# Patient Record
Sex: Male | Born: 1995 | Race: Black or African American | Hispanic: No | Marital: Single | Smoking: Never smoker
Health system: Southern US, Community
[De-identification: ages and names within clinical notes are randomized; demographics above are authoritative.]

## PROBLEM LIST (undated history)

## (undated) DIAGNOSIS — J45909 Unspecified asthma, uncomplicated: Secondary | ICD-10-CM

## (undated) DIAGNOSIS — T7840XA Allergy, unspecified, initial encounter: Secondary | ICD-10-CM

## (undated) DIAGNOSIS — B20 Human immunodeficiency virus [HIV] disease: Secondary | ICD-10-CM

## (undated) HISTORY — DX: Allergy, unspecified, initial encounter: T78.40XA

## (undated) HISTORY — PX: ADENOIDECTOMY: SUR15

---

## 2014-04-16 ENCOUNTER — Emergency Department (HOSPITAL_COMMUNITY)
Admission: EM | Admit: 2014-04-16 | Discharge: 2014-04-17 | Disposition: A | Discharge status: Home / Self Care | Payer: Managed Care, Other (non HMO) | Attending: Emergency Medicine | Admitting: Emergency Medicine

## 2014-04-16 ENCOUNTER — Encounter (HOSPITAL_COMMUNITY): Payer: Self-pay | Admitting: Emergency Medicine

## 2014-04-16 DIAGNOSIS — J45909 Unspecified asthma, uncomplicated: Secondary | ICD-10-CM | POA: Diagnosis not present

## 2014-04-16 DIAGNOSIS — R059 Cough, unspecified: Secondary | ICD-10-CM

## 2014-04-16 DIAGNOSIS — J111 Influenza due to unidentified influenza virus with other respiratory manifestations: Secondary | ICD-10-CM | POA: Insufficient documentation

## 2014-04-16 DIAGNOSIS — R05 Cough: Secondary | ICD-10-CM

## 2014-04-16 DIAGNOSIS — R509 Fever, unspecified: Secondary | ICD-10-CM | POA: Diagnosis present

## 2014-04-16 HISTORY — DX: Unspecified asthma, uncomplicated: J45.909

## 2014-04-16 LAB — RAPID STREP SCREEN (MED CTR MEBANE ONLY): STREPTOCOCCUS, GROUP A SCREEN (DIRECT): NEGATIVE

## 2014-04-16 MED ORDER — ACETAMINOPHEN 325 MG PO TABS
650.0000 mg | ORAL_TABLET | Freq: Once | ORAL | Status: AC
  Administered 2014-04-16: 650 mg via ORAL
  Filled 2014-04-16: qty 2

## 2014-04-16 NOTE — ED Provider Notes (Signed)
CSN: [id number]     Arrival date & time 04/16/14  1918 History   First MD Initiated Contact with Patient 04/16/14 2346     Chief Complaint  Patient presents with  . Sore Throat  . Fever     (Consider location/radiation/quality/duration/timing/severity/associated sxs/prior Treatment) HPI Comments: Patient with history of asthma, presents emergency department with chief complaint of flu-like symptoms. Patient states that he began running a fever, having sore throat, cough, and generalized body aches that started yesterday. He has not tried taking anything to alleviate his symptoms. There are no aggravating or alleviating factors. Patient did not get a flu shot this year. He reports having a fever to 102 prior to arrival. He was given Tylenol in the emergency department.  The history is provided by the patient. No language interpreter was used.    Past Medical History  Diagnosis Date  . Asthma    History reviewed. No pertinent past surgical history. History reviewed. No pertinent family history. History  Substance Use Topics  . Smoking status: Never Smoker   . Smokeless tobacco: Never Used  . Alcohol Use: No    Review of Systems  Constitutional: Positive for fever. Negative for chills.  HENT: Positive for sore throat.   Respiratory: Positive for cough. Negative for shortness of breath.   Cardiovascular: Negative for chest pain.  Gastrointestinal: Negative for nausea, vomiting, diarrhea and constipation.  Genitourinary: Negative for dysuria.  All other systems reviewed and are negative.     Allergies  Review of patient's allergies indicates no known allergies.  Home Medications   Prior to Admission medications   Not on File   BP 127/80 mmHg  Pulse 79  Temp(Src) 100.8 F (38.2 C) (Oral)  Resp 16  Ht 5\' 7"  (1.702 m)  Wt 152 lb (68.947 kg)  BMI 23.80 kg/m2  SpO2 100% Physical Exam  Constitutional: He is oriented to person, place, and time. He appears  well-developed and well-nourished.  HENT:  Head: Normocephalic and atraumatic.  Oropharynx erythematous, no tonsillar exudates, no abscess  Eyes: Conjunctivae and EOM are normal. Pupils are equal, round, and reactive to light. Right eye exhibits no discharge. Left eye exhibits no discharge. No scleral icterus.  Neck: Normal range of motion. Neck supple. No JVD present.  Cardiovascular: Normal rate, regular rhythm and normal heart sounds.  Exam reveals no gallop and no friction rub.   No murmur heard. Pulmonary/Chest: Effort normal and breath sounds normal. No respiratory distress. He has no wheezes. He has no rales. He exhibits no tenderness.  Clear to auscultation  Abdominal: Soft. He exhibits no distension and no mass. There is no tenderness. There is no rebound and no guarding.  Musculoskeletal: Normal range of motion. He exhibits no edema or tenderness.  Neurological: He is alert and oriented to person, place, and time.  Skin: Skin is warm and dry.  Psychiatric: He has a normal mood and affect. His behavior is normal. Judgment and thought content normal.  Nursing note and vitals reviewed.   ED Course  Procedures (including critical care time) Labs Review Labs Reviewed  RAPID STREP SCREEN  CULTURE, GROUP A STREP    Imaging Review Dg Chest 2 View  04/17/2014   CLINICAL DATA:  Sore throat, fever, leg weakness  EXAM: CHEST  2 VIEW  COMPARISON:  None.  FINDINGS: The heart size and mediastinal contours are within normal limits. Both lungs are clear. The visualized skeletal structures are unremarkable.  IMPRESSION: No active cardiopulmonary disease.   Electronically  Signed   By: Elige KoHetal  Patel   On: 04/17/2014 00:55     EKG Interpretation None      MDM   Final diagnoses:  Cough  Influenza    Patient with probable influenza. Patient does have a history of asthma, therefore will check chest x-ray. If negative, plan for discharge.  Patient instructed to increase fluids. Offered  treatment with Tamiflu. Fever control with Tylenol and ibuprofen. Patient is stable and ready for discharge.    Roxy Horsemanobert Itay Mella, PA-C 04/17/14 [id number]  Tomasita CrumbleAdeleke Oni, MD 04/17/14 [phone]

## 2014-04-16 NOTE — ED Notes (Signed)
Pt arrived to the ED with a complaint of a sore throat, fever and leg weakness.  Pt has been experiencing the symptoms for a day

## 2014-04-17 ENCOUNTER — Emergency Department (HOSPITAL_COMMUNITY): Payer: Managed Care, Other (non HMO)

## 2014-04-17 MED ORDER — IBUPROFEN 800 MG PO TABS
800.0000 mg | ORAL_TABLET | Freq: Once | ORAL | Status: AC
  Administered 2014-04-17: 800 mg via ORAL
  Filled 2014-04-17: qty 1

## 2014-04-17 MED ORDER — OSELTAMIVIR PHOSPHATE 75 MG PO CAPS: 75.0000 mg | ORAL_CAPSULE | Freq: Two times a day (BID) | ORAL | Status: DC

## 2014-04-17 NOTE — Discharge Instructions (Signed)

## 2014-04-18 LAB — CULTURE, GROUP A STREP

## 2015-04-05 ENCOUNTER — Emergency Department (HOSPITAL_COMMUNITY): Payer: Managed Care, Other (non HMO)

## 2015-04-05 ENCOUNTER — Encounter (HOSPITAL_COMMUNITY): Payer: Self-pay | Admitting: Emergency Medicine

## 2015-04-05 ENCOUNTER — Emergency Department (HOSPITAL_COMMUNITY)
Admission: EM | Admit: 2015-04-05 | Discharge: 2015-04-06 | Disposition: A | Discharge status: Home / Self Care | Payer: Managed Care, Other (non HMO) | Source: Home / Self Care | Attending: Emergency Medicine | Admitting: Emergency Medicine

## 2015-04-05 DIAGNOSIS — D500 Iron deficiency anemia secondary to blood loss (chronic): Secondary | ICD-10-CM | POA: Diagnosis not present

## 2015-04-05 DIAGNOSIS — S42302A Unspecified fracture of shaft of humerus, left arm, initial encounter for closed fracture: Secondary | ICD-10-CM

## 2015-04-05 DIAGNOSIS — R52 Pain, unspecified: Secondary | ICD-10-CM

## 2015-04-05 DIAGNOSIS — S7011XA Contusion of right thigh, initial encounter: Secondary | ICD-10-CM

## 2015-04-05 LAB — COMPREHENSIVE METABOLIC PANEL
ALT: 15 U/L — AB (ref 17–63)
ANION GAP: 10 (ref 5–15)
AST: 36 U/L (ref 15–41)
Albumin: 4.3 g/dL (ref 3.5–5.0)
Alkaline Phosphatase: 62 U/L (ref 38–126)
BUN: 11 mg/dL (ref 6–20)
CHLORIDE: 106 mmol/L (ref 101–111)
CO2: 25 mmol/L (ref 22–32)
CREATININE: 1.15 mg/dL (ref 0.61–1.24)
Calcium: 9.6 mg/dL (ref 8.9–10.3)
Glucose, Bld: 99 mg/dL (ref 65–99)
POTASSIUM: 4 mmol/L (ref 3.5–5.1)
SODIUM: 141 mmol/L (ref 135–145)
Total Bilirubin: 0.7 mg/dL (ref 0.3–1.2)
Total Protein: 7.8 g/dL (ref 6.5–8.1)

## 2015-04-05 LAB — CBC
HEMATOCRIT: 42.1 % (ref 39.0–52.0)
HEMOGLOBIN: 14 g/dL (ref 13.0–17.0)
MCH: 28.2 pg (ref 26.0–34.0)
MCHC: 33.3 g/dL (ref 30.0–36.0)
MCV: 84.7 fL (ref 78.0–100.0)
PLATELETS: 251 10*3/uL (ref 150–400)
RBC: 4.97 MIL/uL (ref 4.22–5.81)
RDW: 12.9 % (ref 11.5–15.5)
WBC: 6.8 10*3/uL (ref 4.0–10.5)

## 2015-04-05 LAB — SAMPLE TO BLOOD BANK

## 2015-04-05 LAB — PROTIME-INR
INR: 1.16 (ref 0.00–1.49)
PROTHROMBIN TIME: 14.9 s (ref 11.6–15.2)

## 2015-04-05 LAB — ETHANOL

## 2015-04-05 LAB — CDS SEROLOGY

## 2015-04-05 MED ORDER — ONDANSETRON HCL 4 MG/2ML IJ SOLN
4.0000 mg | Freq: Once | INTRAMUSCULAR | Status: AC
  Administered 2015-04-05: 4 mg via INTRAVENOUS
  Filled 2015-04-05: qty 2

## 2015-04-05 MED ORDER — HYDROMORPHONE HCL 1 MG/ML IJ SOLN
1.0000 mg | Freq: Once | INTRAMUSCULAR | Status: AC
  Administered 2015-04-05: 1 mg via INTRAVENOUS
  Filled 2015-04-05: qty 1

## 2015-04-05 MED ORDER — FENTANYL CITRATE (PF) 100 MCG/2ML IJ SOLN
50.0000 ug | Freq: Once | INTRAMUSCULAR | Status: AC
  Administered 2015-04-05: 50 ug via INTRAVENOUS
  Filled 2015-04-05: qty 2

## 2015-04-05 MED ORDER — KETOROLAC TROMETHAMINE 30 MG/ML IJ SOLN
30.0000 mg | Freq: Once | INTRAMUSCULAR | Status: AC
  Administered 2015-04-05: 30 mg via INTRAVENOUS
  Filled 2015-04-05: qty 1

## 2015-04-05 MED ORDER — HYDROCODONE-ACETAMINOPHEN 5-325 MG PO TABS
1.0000 | ORAL_TABLET | Freq: Once | ORAL | Status: AC
  Administered 2015-04-05: 1 via ORAL
  Filled 2015-04-05: qty 1

## 2015-04-05 MED ORDER — HYDROCODONE-ACETAMINOPHEN 5-325 MG PO TABS: 1.0000 | ORAL_TABLET | Freq: Four times a day (QID) | ORAL | Status: DC | PRN

## 2015-04-05 NOTE — Progress Notes (Signed)
Orthopedic Tech Progress Note Patient Details:  Kenneth Frank 1996-05-08 [id number]  Ortho Devices Type of Ortho Device: Ace wrap, Shoulder immobilizer, Coapt Ortho Device/Splint Location: LUE Ortho Device/Splint Interventions: Ordered, Application   [name], [name] 04/05/2015, 10:38 PM
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
## 2015-04-05 NOTE — Discharge Instructions (Signed)
As discussed, it is very important that she follow up with our orthopedic colleague early next week for additional evaluation of your broken arm.  Please monitor your condition carefully, and do not hesitate to return here if you develop new, or concerning changes in your condition.
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
## 2015-04-05 NOTE — ED Notes (Signed)
Ortho to come place splint on pt.

## 2015-04-07 ENCOUNTER — Emergency Department (HOSPITAL_COMMUNITY): Payer: Managed Care, Other (non HMO)

## 2015-04-07 ENCOUNTER — Inpatient Hospital Stay (HOSPITAL_COMMUNITY)
Admission: EM | Admit: 2015-04-07 | Discharge: 2015-04-11 | DRG: 562 | Disposition: A | Discharge status: Rehabilitation Facility | Payer: Managed Care, Other (non HMO) | Attending: General Surgery | Admitting: General Surgery

## 2015-04-07 ENCOUNTER — Inpatient Hospital Stay (HOSPITAL_COMMUNITY): Payer: Managed Care, Other (non HMO)

## 2015-04-07 ENCOUNTER — Encounter (HOSPITAL_COMMUNITY): Payer: Self-pay

## 2015-04-07 DIAGNOSIS — K5900 Constipation, unspecified: Secondary | ICD-10-CM | POA: Diagnosis not present

## 2015-04-07 DIAGNOSIS — S42302S Unspecified fracture of shaft of humerus, left arm, sequela: Secondary | ICD-10-CM | POA: Diagnosis not present

## 2015-04-07 DIAGNOSIS — S064XAA Epidural hemorrhage with loss of consciousness status unknown, initial encounter: Secondary | ICD-10-CM | POA: Diagnosis present

## 2015-04-07 DIAGNOSIS — S42302A Unspecified fracture of shaft of humerus, left arm, initial encounter for closed fracture: Principal | ICD-10-CM | POA: Diagnosis present

## 2015-04-07 DIAGNOSIS — R40243 Glasgow coma scale score 3-8, unspecified time: Secondary | ICD-10-CM | POA: Diagnosis present

## 2015-04-07 DIAGNOSIS — J45909 Unspecified asthma, uncomplicated: Secondary | ICD-10-CM | POA: Diagnosis present

## 2015-04-07 DIAGNOSIS — D500 Iron deficiency anemia secondary to blood loss (chronic): Secondary | ICD-10-CM | POA: Diagnosis present

## 2015-04-07 DIAGNOSIS — S064X9A Epidural hemorrhage with loss of consciousness of unspecified duration, initial encounter: Secondary | ICD-10-CM

## 2015-04-07 DIAGNOSIS — S064X0A Epidural hemorrhage without loss of consciousness, initial encounter: Secondary | ICD-10-CM | POA: Diagnosis present

## 2015-04-07 DIAGNOSIS — T148XXA Other injury of unspecified body region, initial encounter: Secondary | ICD-10-CM

## 2015-04-07 DIAGNOSIS — G44309 Post-traumatic headache, unspecified, not intractable: Secondary | ICD-10-CM | POA: Diagnosis not present

## 2015-04-07 DIAGNOSIS — S0219XA Other fracture of base of skull, initial encounter for closed fracture: Secondary | ICD-10-CM | POA: Diagnosis present

## 2015-04-07 DIAGNOSIS — D62 Acute posthemorrhagic anemia: Secondary | ICD-10-CM | POA: Diagnosis present

## 2015-04-07 DIAGNOSIS — H9312 Tinnitus, left ear: Secondary | ICD-10-CM | POA: Diagnosis present

## 2015-04-07 DIAGNOSIS — S069X0S Unspecified intracranial injury without loss of consciousness, sequela: Secondary | ICD-10-CM | POA: Diagnosis not present

## 2015-04-07 DIAGNOSIS — S0219XS Other fracture of base of skull, sequela: Secondary | ICD-10-CM | POA: Diagnosis not present

## 2015-04-07 LAB — BASIC METABOLIC PANEL
Anion gap: 6 (ref 5–15)
BUN: 10 mg/dL (ref 6–20)
CALCIUM: 8.6 mg/dL — AB (ref 8.9–10.3)
CO2: 29 mmol/L (ref 22–32)
CREATININE: 0.94 mg/dL (ref 0.61–1.24)
Chloride: 106 mmol/L (ref 101–111)
GFR calc Af Amer: >60 mL/min (ref >60)
GLUCOSE: 98 mg/dL (ref 65–99)
Potassium: 3.5 mmol/L (ref 3.5–5.1)
Sodium: 141 mmol/L (ref 135–145)

## 2015-04-07 LAB — CBC WITH DIFFERENTIAL/PLATELET
BASOS ABS: 0 10*3/uL (ref 0.0–0.1)
Basophils Relative: 0 %
EOS PCT: 2 %
Eosinophils Absolute: 0.1 10*3/uL (ref 0.0–0.7)
HEMATOCRIT: 32.3 % — AB (ref 39.0–52.0)
Hemoglobin: 10.7 g/dL — ABNORMAL LOW (ref 13.0–17.0)
LYMPHS PCT: 43 %
Lymphs Abs: 1.3 10*3/uL (ref 0.7–4.0)
MCH: 28.5 pg (ref 26.0–34.0)
MCHC: 33.1 g/dL (ref 30.0–36.0)
MCV: 85.9 fL (ref 78.0–100.0)
MONO ABS: 0.5 10*3/uL (ref 0.1–1.0)
MONOS PCT: 16 %
NEUTROS ABS: 1.2 10*3/uL — AB (ref 1.7–7.7)
Neutrophils Relative %: 40 %
PLATELETS: 136 10*3/uL — AB (ref 150–400)
RBC: 3.76 MIL/uL — ABNORMAL LOW (ref 4.22–5.81)
RDW: 13 % (ref 11.5–15.5)
WBC: 2.9 10*3/uL — ABNORMAL LOW (ref 4.0–10.5)

## 2015-04-07 LAB — MRSA PCR SCREENING: MRSA by PCR: NEGATIVE

## 2015-04-07 MED ORDER — ONDANSETRON HCL 4 MG/2ML IJ SOLN
4.0000 mg | Freq: Four times a day (QID) | INTRAMUSCULAR | Status: DC | PRN
  Administered 2015-04-07 – 2015-04-10 (×4): 4 mg via INTRAVENOUS
  Filled 2015-04-07 (×5): qty 2

## 2015-04-07 MED ORDER — KETOROLAC TROMETHAMINE 30 MG/ML IJ SOLN
30.0000 mg | Freq: Once | INTRAMUSCULAR | Status: AC
  Administered 2015-04-07: 30 mg via INTRAVENOUS
  Filled 2015-04-07: qty 1

## 2015-04-07 MED ORDER — IOHEXOL 300 MG/ML  SOLN
75.0000 mL | Freq: Once | INTRAMUSCULAR | Status: AC | PRN
  Administered 2015-04-07: 75 mL via INTRAVENOUS

## 2015-04-07 MED ORDER — HYDROMORPHONE HCL 1 MG/ML IJ SOLN
1.0000 mg | Freq: Once | INTRAMUSCULAR | Status: AC
  Administered 2015-04-07: 1 mg via INTRAVENOUS
  Filled 2015-04-07: qty 1

## 2015-04-07 MED ORDER — PANTOPRAZOLE SODIUM 40 MG IV SOLR
40.0000 mg | Freq: Every day | INTRAVENOUS | Status: DC
  Administered 2015-04-07: 40 mg via INTRAVENOUS
  Filled 2015-04-07: qty 40

## 2015-04-07 MED ORDER — FLUTICASONE-SALMETEROL 100-50 MCG/DOSE IN AEPB
1.0000 | INHALATION_SPRAY | Freq: Two times a day (BID) | RESPIRATORY_TRACT | Status: DC
  Filled 2015-04-07: qty 14

## 2015-04-07 MED ORDER — HYDROCODONE-ACETAMINOPHEN 5-325 MG PO TABS: 1.0000 | ORAL_TABLET | Freq: Four times a day (QID) | ORAL | Status: DC | PRN

## 2015-04-07 MED ORDER — SODIUM CHLORIDE 0.9 % IV SOLN
INTRAVENOUS | Status: DC
  Administered 2015-04-07 (×3): via INTRAVENOUS

## 2015-04-07 MED ORDER — PANTOPRAZOLE SODIUM 40 MG PO TBEC: 40.0000 mg | DELAYED_RELEASE_TABLET | Freq: Every day | ORAL | Status: DC

## 2015-04-07 MED ORDER — ONDANSETRON HCL 4 MG PO TABS: 4.0000 mg | ORAL_TABLET | Freq: Four times a day (QID) | ORAL | Status: DC | PRN

## 2015-04-07 MED ORDER — SODIUM CHLORIDE 0.9 % IV BOLUS (SEPSIS)
1000.0000 mL | Freq: Once | INTRAVENOUS | Status: AC
  Administered 2015-04-07: 1000 mL via INTRAVENOUS

## 2015-04-07 MED ORDER — MONTELUKAST SODIUM 5 MG PO CHEW
5.0000 mg | CHEWABLE_TABLET | Freq: Every day | ORAL | Status: DC
  Administered 2015-04-08 – 2015-04-10 (×3): 5 mg via ORAL
  Filled 2015-04-07 (×5): qty 1

## 2015-04-07 MED ORDER — HYDROMORPHONE HCL 1 MG/ML IJ SOLN
0.5000 mg | INTRAMUSCULAR | Status: DC | PRN
  Administered 2015-04-07 – 2015-04-08 (×7): 0.5 mg via INTRAVENOUS
  Filled 2015-04-07 (×5): qty 1

## 2015-04-07 MED ORDER — MOMETASONE FURO-FORMOTEROL FUM 100-5 MCG/ACT IN AERO
2.0000 | INHALATION_SPRAY | Freq: Two times a day (BID) | RESPIRATORY_TRACT | Status: DC
  Administered 2015-04-08 – 2015-04-10 (×3): 2 via RESPIRATORY_TRACT
  Filled 2015-04-07 (×2): qty 8.8
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
## 2015-04-07 NOTE — ED Notes (Signed)
Patient transported to CT 
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
## 2015-04-07 NOTE — Progress Notes (Signed)
PT Cancellation Note  Patient Details Name: Kenneth Frank MRN: [id number] DOB: [birth year]   Cancelled Treatment:    Reason Eval/Treat Not Completed: Patient not medically ready.  Noted Neurosurgery consulted, but note not completed to indicate if pt is appropriate for PT eval at this time.  Spoke with RN who indicates she is still awaiting Neurosurgery recs.  Will hold PT eval at this time and f/u as appropriate.     [name], Alison MurrayMegan F 04/07/2015, 1:19 PM

## 2015-04-08 ENCOUNTER — Inpatient Hospital Stay (HOSPITAL_COMMUNITY): Payer: Managed Care, Other (non HMO)

## 2015-04-08 DIAGNOSIS — S0219XA Other fracture of base of skull, initial encounter for closed fracture: Secondary | ICD-10-CM | POA: Diagnosis present

## 2015-04-08 DIAGNOSIS — D62 Acute posthemorrhagic anemia: Secondary | ICD-10-CM | POA: Diagnosis present

## 2015-04-08 DIAGNOSIS — S42302A Unspecified fracture of shaft of humerus, left arm, initial encounter for closed fracture: Secondary | ICD-10-CM | POA: Diagnosis present

## 2015-04-08 MED ORDER — HYDROMORPHONE HCL 1 MG/ML IJ SOLN
0.5000 mg | INTRAMUSCULAR | Status: DC | PRN
  Administered 2015-04-08 – 2015-04-10 (×5): 0.5 mg via INTRAVENOUS
  Filled 2015-04-08 (×6): qty 1

## 2015-04-08 MED ORDER — POLYETHYLENE GLYCOL 3350 17 G PO PACK
17.0000 g | PACK | Freq: Every day | ORAL | Status: DC
  Administered 2015-04-08 – 2015-04-10 (×2): 17 g via ORAL
  Filled 2015-04-08 (×3): qty 1

## 2015-04-08 MED ORDER — DOCUSATE SODIUM 100 MG PO CAPS
100.0000 mg | ORAL_CAPSULE | Freq: Two times a day (BID) | ORAL | Status: DC
  Administered 2015-04-08 – 2015-04-10 (×5): 100 mg via ORAL
  Filled 2015-04-08 (×7): qty 1

## 2015-04-08 MED ORDER — OXYCODONE HCL 5 MG PO TABS
5.0000 mg | ORAL_TABLET | ORAL | Status: DC | PRN
  Administered 2015-04-08: 15 mg via ORAL
  Administered 2015-04-09 – 2015-04-11 (×10): 10 mg via ORAL
  Filled 2015-04-08 (×7): qty 2
  Filled 2015-04-08: qty 3
  Filled 2015-04-08 (×3): qty 2

## 2015-04-08 NOTE — Progress Notes (Signed)
04/08/2015  [facility] [place] Levels I-VI of Cognitive Functioning-Daily Tracking Sheet         Date of Onset __11/4/16_______  Level of function Behavioral Characteristics Initial Eval.  Date: __11/5/16__ Date and initial when observed   Level I No response Total Assistance Complete absence of observable change in behavior when presented visual, auditory, tactile, proprioceptive, vestibular or painful stimuli.             Level II Generalized response  Total Assistance Demonstrates generalized reflex response to painful stimuli       Responds to repeated auditory stimuli with increased or decreased activity       Responds to external stimuli with physiological changes generalized, gross body movement and / or not purposeful vocalization               Responses noted above may be same regardless of type and location of stimuli        Responses may be significantly delayed      Level III Localized response  Total Assistance Demonstrates withdrawal or vocalization to painful stimuli       Turns toward or away from auditory stimuli        Blinks when strong light crosses visual field        Follows moving object passed within visual field        Responds to discomfort by pulling tubes or restraints       Responds inconsistently to simple commands       Responses directly related to type of stimulus       May respond to some persons (especially friends and family) but not to others       Level IV Confused/Agitated  Maximal Assistance       Alert and in heightened state of anxiety        Purposeful attempts to remove restraints or tubes or crawl out of bed        May perform motor activities such as sitting, reaching and walking without any apparent purpose or upon another's request        Brief and usually non purposeful moments of focused and sustained attention       Post traumatic amnesia state        Absent goal directed, problem  solving, self monitoring behavior       May cry or scream out of proportion to stimulus even after it's removal       May exhibit aggressive or flight behavior        Mood swing from euphoric to hostile with no apparent relationship to environmental events        Verbalizations are frequently incoherent and/or inappropriate to activity or environment             Level V   Confused/InappropriateNon-Agitated  Maximal Assistance Alert, not agitated but may wander randomly or with vague intention of going home        May become agitated in response to external stimulation and/or lack of environmental structure. 04/08/15- RBM      Not orientated to person, place and time.       Frequent brief periods, non-purposeful sustained attention.       Severely impaired recent memory, with confusion of past and present in reaction to ongoing activity.        Absent goal directed, problem solving behavior.        Often demonstrates inappropriate use of objects without external direction.  May be able to perform previously learned tasks when structure and cues provided.       Unable to learn new information       Able to respond appropriately to simple commands fairly consistently with external structure and cues. 04/08/15- RBM      Responses to simple commands without external structure are random and non-purposeful in relation to the command       Able to converse on a social, automatic level for brief periods of time when provided external structure and cues.        Verbalizations about present events become inappropriate and confabulatory when external structure and cues are not provided.                Level of function Level VI Confused/Appropriate  Maximal Assistance Behavioral Characteristics Initial Eval.  Date: _____ Date and initial when observed    Able to attend to highly familiar tasks in non-distracting environment for 30 minutes with moderate redirection.        Remote memory has more depth and detail than recent memory        Vague recognition of some staff.       Able to use assistive memory aide with maximal assist.       Emerging awareness of appropriate response to self, family and basic needs.        Emerging goal directed behavior.        Moderate assist to problem solve barriers to task completion.        Supervised for old learning (e.g. self care).       Shows carry over for relearned familiar tasks (e.g. self care).       Maximal assistance for new learning with little or no carry over.       Unaware of impairments, disabilities and safety risks.       Consistently follows simple directions       Verbal expressions are appropriate in highly familiar and structured situations.       DO  NOT SIGN NOTE. ALWAYS SHARE UNLESS PATIENT HAS PROGRESSED BEYOND A RANCHO LEVEL VI. THIS WAY, ALL TEAM MEMBERS HAVE ACCESS TO DOCUMENT ON TRACKING SHEET. THANK YOU.

## 2015-04-08 NOTE — Progress Notes (Signed)
No acute events. Complains of headaches. AVSS Awake and alert Follows commands throughout CT reviewed, stable Neurologically unchanged, no need for surgical intervention Continue medical management
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
## 2015-04-09 MED ORDER — BACITRACIN-NEOMYCIN-POLYMYXIN OINTMENT TUBE
TOPICAL_OINTMENT | CUTANEOUS | Status: DC | PRN
  Filled 2015-04-09: qty 15

## 2015-04-09 NOTE — Progress Notes (Signed)
Patient ID: Carolanne Grumblingaron Pardue, male   DOB: 1995/08/23, 19 y.o.   MRN: [id number] Will order a Sarmiento functional fracture brace to be placed around his left humerus shaft fracture.  i have spoken with the Ortho Tech who will order the brace through [employer].  The current coaptation slint will be removed and replaced by this brace.
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
## 2015-04-09 NOTE — Progress Notes (Signed)
Patient sleeping Headaches better per family Neurological function unchanged per family and nurse Stable, continue rehabilitation

## 2015-04-10 ENCOUNTER — Encounter (HOSPITAL_COMMUNITY): Payer: Self-pay | Admitting: Physical Medicine and Rehabilitation

## 2015-04-10 DIAGNOSIS — D696 Thrombocytopenia, unspecified: Secondary | ICD-10-CM

## 2015-04-10 DIAGNOSIS — S42302A Unspecified fracture of shaft of humerus, left arm, initial encounter for closed fracture: Secondary | ICD-10-CM

## 2015-04-10 DIAGNOSIS — D62 Acute posthemorrhagic anemia: Secondary | ICD-10-CM

## 2015-04-10 DIAGNOSIS — S069X0S Unspecified intracranial injury without loss of consciousness, sequela: Secondary | ICD-10-CM

## 2015-04-10 DIAGNOSIS — G44309 Post-traumatic headache, unspecified, not intractable: Secondary | ICD-10-CM

## 2015-04-10 DIAGNOSIS — G8911 Acute pain due to trauma: Secondary | ICD-10-CM

## 2015-04-10 DIAGNOSIS — K5900 Constipation, unspecified: Secondary | ICD-10-CM

## 2015-04-10 DIAGNOSIS — J45909 Unspecified asthma, uncomplicated: Secondary | ICD-10-CM

## 2015-04-10 NOTE — Progress Notes (Signed)
PT Cancellation Note  Patient Details Name: Kenneth Frank MRN: [id number] DOB: 10-Jul-1995   Cancelled Treatment:    Reason Eval/Treat Not Completed: Per RN, pt was put back to bed and then got up again this pm. SLP worked with pt and mother became upset because she feels pt is not being allowed to rest. Nursing assisted pt back to bed less than 45 minutes ago. PT deferred at this time per family request.   Kenneth Frank 04/10/2015, 4:08 PM  Pager [phone]
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
## 2015-04-10 NOTE — Progress Notes (Signed)
Subjective: Patient reports lessening headache  Objective: Vital signs in last 24 hours: Temp:  [97.7 F (36.5 C)-99.9 F (37.7 C)] 99.9 F (37.7 C) (11/07 0537) Pulse Rate:  [56-102] 84 (11/07 0537) Resp:  [16-18] 16 (11/07 0537) BP: (130-136)/(77-91) 131/80 mmHg (11/07 0537) SpO2:  [98 %-100 %] 100 % (11/07 0537)  Intake/Output from previous day:   Intake/Output this shift:    Awakens to voice, follows commands all extremities. Family present reporting increased LUE pain since yesterday.  Pt reports headache continues to improve.   Lab Results: No results for input(s): WBC, HGB, HCT, PLT in the last 72 hours. BMET No results for input(s): NA, K, CL, CO2, GLUCOSE, BUN, CREATININE, CALCIUM in the last 72 hours.  Studies/Results: No results found.  Assessment/Plan: Improving   LOS: 3 days  No NS follow-up needed unless headache worsens or persists three more weeks. If either, pt should have f/u head CT & return to office.    Kenneth Frank, Kenneth Frank 04/10/2015, 7:55 AM
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
## 2015-04-10 NOTE — Progress Notes (Signed)
Patient ID: Kenneth Frank, male   DOB: 21-Nov-1995, 19 y.o.   MRN: [id number]   LOS: 3 days   Subjective: Sleeping, says feels better.   Objective: Vital signs in last 24 hours: Temp:  [97.7 F (36.5 C)-99.9 F (37.7 C)] 99.9 F (37.7 C) (11/07 0537) Pulse Rate:  [56-102] 84 (11/07 0537) Resp:  [16-18] 16 (11/07 0537) BP: (130-136)/(77-91) 131/80 mmHg (11/07 0537) SpO2:  [98 %-100 %] 100 % (11/07 0537)    Physical Exam General appearance: no distress Resp: clear to auscultation bilaterally Cardio: regular rate and rhythm GI: normal findings: bowel sounds normal and soft, non-tender   Assessment/Plan: PHBC TBI w/temp fx, EDH -- Stable EDH, Dr. Ezzard StandingNewman recommending OP f/u for fx Left humerus fx -- NWB, per ortho, likely f/u as OP as originally planned ABL anemia -- Mild FEN -- Orals for pain, SL IV VTE -- SCD's Dispo -- CIR when bed available    Freeman CaldronMichael J. Qusai Kem, PA-C Pager: [phone] General Trauma PA Pager: [phone]  04/10/2015

## 2015-04-11 ENCOUNTER — Encounter (HOSPITAL_COMMUNITY): Payer: Self-pay | Admitting: Emergency Medicine

## 2015-04-11 ENCOUNTER — Inpatient Hospital Stay (HOSPITAL_COMMUNITY)
Admission: AD | Admit: 2015-04-11 | Discharge: 2015-04-15 | DRG: 493 | Disposition: A | Discharge status: Home / Self Care | Payer: Managed Care, Other (non HMO) | Source: Intra-hospital | Attending: Physical Medicine & Rehabilitation | Admitting: Physical Medicine & Rehabilitation

## 2015-04-11 ENCOUNTER — Inpatient Hospital Stay (HOSPITAL_COMMUNITY): Payer: Managed Care, Other (non HMO)

## 2015-04-11 DIAGNOSIS — E871 Hypo-osmolality and hyponatremia: Secondary | ICD-10-CM | POA: Diagnosis present

## 2015-04-11 DIAGNOSIS — K5900 Constipation, unspecified: Secondary | ICD-10-CM | POA: Diagnosis present

## 2015-04-11 DIAGNOSIS — S0219XD Other fracture of base of skull, subsequent encounter for fracture with routine healing: Secondary | ICD-10-CM

## 2015-04-11 DIAGNOSIS — D62 Acute posthemorrhagic anemia: Secondary | ICD-10-CM | POA: Diagnosis present

## 2015-04-11 DIAGNOSIS — S064X9D Epidural hemorrhage with loss of consciousness of unspecified duration, subsequent encounter: Secondary | ICD-10-CM | POA: Diagnosis present

## 2015-04-11 DIAGNOSIS — T148XXA Other injury of unspecified body region, initial encounter: Secondary | ICD-10-CM

## 2015-04-11 DIAGNOSIS — S42302S Unspecified fracture of shaft of humerus, left arm, sequela: Secondary | ICD-10-CM

## 2015-04-11 DIAGNOSIS — D709 Neutropenia, unspecified: Secondary | ICD-10-CM | POA: Diagnosis present

## 2015-04-11 DIAGNOSIS — S42302D Unspecified fracture of shaft of humerus, left arm, subsequent encounter for fracture with routine healing: Secondary | ICD-10-CM | POA: Diagnosis not present

## 2015-04-11 DIAGNOSIS — R4189 Other symptoms and signs involving cognitive functions and awareness: Secondary | ICD-10-CM | POA: Diagnosis present

## 2015-04-11 DIAGNOSIS — J45909 Unspecified asthma, uncomplicated: Secondary | ICD-10-CM | POA: Diagnosis present

## 2015-04-11 DIAGNOSIS — R51 Headache: Secondary | ICD-10-CM | POA: Diagnosis present

## 2015-04-11 DIAGNOSIS — S069X0A Unspecified intracranial injury without loss of consciousness, initial encounter: Secondary | ICD-10-CM | POA: Diagnosis present

## 2015-04-11 DIAGNOSIS — S0219XS Other fracture of base of skull, sequela: Secondary | ICD-10-CM | POA: Diagnosis not present

## 2015-04-11 DIAGNOSIS — S069X0S Unspecified intracranial injury without loss of consciousness, sequela: Secondary | ICD-10-CM | POA: Diagnosis not present

## 2015-04-11 DIAGNOSIS — S42302A Unspecified fracture of shaft of humerus, left arm, initial encounter for closed fracture: Secondary | ICD-10-CM | POA: Diagnosis present

## 2015-04-11 DIAGNOSIS — S0219XA Other fracture of base of skull, initial encounter for closed fracture: Secondary | ICD-10-CM | POA: Diagnosis present

## 2015-04-11 MED ORDER — DIPHENHYDRAMINE HCL 12.5 MG/5ML PO ELIX: 12.5000 mg | ORAL_SOLUTION | Freq: Four times a day (QID) | ORAL | Status: DC | PRN

## 2015-04-11 MED ORDER — METHOCARBAMOL 500 MG PO TABS
500.0000 mg | ORAL_TABLET | Freq: Four times a day (QID) | ORAL | Status: DC | PRN
  Administered 2015-04-11 – 2015-04-15 (×5): 500 mg via ORAL
  Filled 2015-04-11 (×4): qty 1

## 2015-04-11 MED ORDER — BOOST / RESOURCE BREEZE PO LIQD
1.0000 | Freq: Three times a day (TID) | ORAL | Status: DC
  Administered 2015-04-11 – 2015-04-12 (×2): 1 via ORAL

## 2015-04-11 MED ORDER — ONDANSETRON HCL 4 MG PO TABS: 4.0000 mg | ORAL_TABLET | Freq: Four times a day (QID) | ORAL | Status: DC | PRN

## 2015-04-11 MED ORDER — BISACODYL 10 MG RE SUPP: 10.0000 mg | Freq: Every day | RECTAL | Status: DC | PRN

## 2015-04-11 MED ORDER — ALBUTEROL SULFATE (2.5 MG/3ML) 0.083% IN NEBU: 2.5000 mg | INHALATION_SOLUTION | RESPIRATORY_TRACT | Status: DC | PRN

## 2015-04-11 MED ORDER — ACETAMINOPHEN 325 MG PO TABS: 325.0000 mg | ORAL_TABLET | ORAL | Status: DC | PRN

## 2015-04-11 MED ORDER — TRAZODONE HCL 50 MG PO TABS
25.0000 mg | ORAL_TABLET | Freq: Every evening | ORAL | Status: DC | PRN
  Filled 2015-04-11: qty 1

## 2015-04-11 MED ORDER — OXYCODONE HCL 5 MG PO TABS
5.0000 mg | ORAL_TABLET | ORAL | Status: DC | PRN
  Administered 2015-04-11 – 2015-04-15 (×12): 10 mg via ORAL
  Filled 2015-04-11 (×13): qty 2

## 2015-04-11 MED ORDER — SENNOSIDES-DOCUSATE SODIUM 8.6-50 MG PO TABS
2.0000 | ORAL_TABLET | Freq: Two times a day (BID) | ORAL | Status: DC
Start: 2015-04-11 — End: 2015-04-13
  Administered 2015-04-11 – 2015-04-13 (×4): 2 via ORAL
  Filled 2015-04-11 (×4): qty 2

## 2015-04-11 MED ORDER — ALUM & MAG HYDROXIDE-SIMETH 200-200-20 MG/5ML PO SUSP: 30.0000 mL | ORAL | Status: DC | PRN

## 2015-04-11 MED ORDER — BACITRACIN-NEOMYCIN-POLYMYXIN OINTMENT TUBE
TOPICAL_OINTMENT | CUTANEOUS | Status: DC | PRN
  Filled 2015-04-11: qty 15

## 2015-04-11 MED ORDER — GUAIFENESIN-DM 100-10 MG/5ML PO SYRP: 5.0000 mL | ORAL_SOLUTION | Freq: Four times a day (QID) | ORAL | Status: DC | PRN

## 2015-04-11 MED ORDER — ONDANSETRON HCL 4 MG/2ML IJ SOLN
4.0000 mg | Freq: Four times a day (QID) | INTRAMUSCULAR | Status: DC | PRN
Start: 2015-04-11 — End: 2015-04-15
  Administered 2015-04-15: 4 mg via INTRAVENOUS

## 2015-04-11 MED ORDER — FLEET ENEMA 7-19 GM/118ML RE ENEM: 1.0000 | ENEMA | Freq: Once | RECTAL | Status: DC | PRN

## 2015-04-11 NOTE — Progress Notes (Signed)
Rehab admissions - [facility] for today.  Bed available and will admit to acute inpatient rehab today.  Call me for questions.  #[phone]

## 2015-04-11 NOTE — Progress Notes (Signed)
Pt d/c to 4w4. Assessment stable
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
## 2015-04-11 NOTE — Clinical Social Work Note (Signed)
Clinical Social Worker continuing to follow patient and family for support and discharge planning needs.  Patient has a bed available at inpatient rehab pending [facility] [facility].  Patient with supportive family.  Clinical Social Worker will sign off for now as social work intervention is no longer needed. Please consult us again if new need arises.  Macario GoldsJesse Henny Strauch, Kentucky[place]CSW [phone]
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
## 2015-04-11 NOTE — Interval H&P Note (Signed)
Kenneth Frank was admitted today to Inpatient Rehabilitation with the diagnosis of TBI, polytrauma.  The patient's history has been reviewed, patient examined, and there is no change in status.  Patient continues to be appropriate for intensive inpatient rehabilitation.  I have reviewed the patient's chart and labs.  Questions were answered to the patient's satisfaction. The PAPE has been reviewed and assessment remains appropriate.  SWARTZ,ZACHARY T 04/11/2015, 4:48 PM
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
## 2015-04-11 NOTE — Progress Notes (Signed)
Patient ID: Kenneth Frank, male   DOB: 1995/12/04, 19 y.o.   MRN: [id number] Functional fracture brace fitting appropriately over left humerus fracture.  Will order ne plan films of humerus today for further assessment.
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
## 2015-04-11 NOTE — Care Management Note (Signed)
Case Management Note  Patient Details  Name: Kenneth Frank MRN: [id number] Date of Birth: 1995/09/21  Subjective/Objective:    Pt has been approved by insurance for admission to inpatient rehab today.            Action/Plan: Plan dc to CIR later today.    Expected Discharge Date:  04/11/15             Expected Discharge Plan:  IP Rehab Facility  In-House Referral:     Discharge planning Services  CM Consult  Post Acute Care Choice:    Choice offered to:     DME Arranged:    DME Agency:     HH Arranged:    HH Agency:     Status of Service:  Completed, signed off  Medicare Important Message Given:    Date Medicare IM Given:    Medicare IM give by:    Date Additional Medicare IM Given:    Additional Medicare Important Message give by:     If discussed at Long Length of Stay Meetings, dates discussed:    Additional Comments:  Quintella BatonJulie W. Erik Nessel, RN, BSN  Trauma/Neuro ICU Case Manager [phone]

## 2015-04-11 NOTE — Progress Notes (Signed)
Rehab admissions - Evaluated for possible admission.  I met with patient's mom this am.  She is interested in inpatient rehab admission.  I sent clinicals to [facility] yesterday and updated them this am.  Awaiting call back from [facility] case manager regarding potential inpatient rehab admission.  I hope to hear back from case manager later today.  We do have rehab beds available today.  Call me for questions.  #[phone]

## 2015-04-12 ENCOUNTER — Inpatient Hospital Stay (HOSPITAL_COMMUNITY): Payer: Managed Care, Other (non HMO) | Admitting: Occupational Therapy

## 2015-04-12 ENCOUNTER — Inpatient Hospital Stay (HOSPITAL_COMMUNITY): Payer: Managed Care, Other (non HMO) | Admitting: Speech Pathology

## 2015-04-12 ENCOUNTER — Inpatient Hospital Stay (HOSPITAL_COMMUNITY): Payer: Managed Care, Other (non HMO) | Admitting: Physical Therapy

## 2015-04-12 LAB — COMPREHENSIVE METABOLIC PANEL
ALBUMIN: 3.5 g/dL (ref 3.5–5.0)
ALK PHOS: 54 U/L (ref 38–126)
ALT: 11 U/L — AB (ref 17–63)
AST: 19 U/L (ref 15–41)
Anion gap: 9 (ref 5–15)
BUN: 14 mg/dL (ref 6–20)
CO2: 31 mmol/L (ref 22–32)
Calcium: 9.8 mg/dL (ref 8.9–10.3)
Chloride: 94 mmol/L — ABNORMAL LOW (ref 101–111)
Creatinine, Ser: 0.91 mg/dL (ref 0.61–1.24)
GFR calc non Af Amer: >60 mL/min (ref >60)
Glucose, Bld: 103 mg/dL — ABNORMAL HIGH (ref 65–99)
Potassium: 4.5 mmol/L (ref 3.5–5.1)
SODIUM: 134 mmol/L — AB (ref 135–145)
Total Bilirubin: 1 mg/dL (ref 0.3–1.2)
Total Protein: 8.1 g/dL (ref 6.5–8.1)

## 2015-04-12 LAB — CBC WITH DIFFERENTIAL/PLATELET
BASOS ABS: 0 10*3/uL (ref 0.0–0.1)
BASOS PCT: 1 %
EOS ABS: 0.1 10*3/uL (ref 0.0–0.7)
EOS PCT: 2 %
HCT: 39.3 % (ref 39.0–52.0)
HEMOGLOBIN: 13.5 g/dL (ref 13.0–17.0)
LYMPHS ABS: 1.2 10*3/uL (ref 0.7–4.0)
Lymphocytes Relative: 33 %
MCH: 28.6 pg (ref 26.0–34.0)
MCHC: 34.4 g/dL (ref 30.0–36.0)
MCV: 83.3 fL (ref 78.0–100.0)
Monocytes Absolute: 0.6 10*3/uL (ref 0.1–1.0)
Monocytes Relative: 17 %
NEUTROS PCT: 47 %
Neutro Abs: 1.7 10*3/uL (ref 1.7–7.7)
PLATELETS: 182 10*3/uL (ref 150–400)
RBC: 4.72 MIL/uL (ref 4.22–5.81)
RDW: 12.4 % (ref 11.5–15.5)
WBC: 3.7 10*3/uL — AB (ref 4.0–10.5)

## 2015-04-12 MED ORDER — ENSURE ENLIVE PO LIQD
237.0000 mL | Freq: Two times a day (BID) | ORAL | Status: DC
  Administered 2015-04-12 – 2015-04-13 (×2): 237 mL via ORAL
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
## 2015-04-12 NOTE — Progress Notes (Signed)
Patient information reviewed and entered into eRehab system by Hayes Czaja, RN, CRRN, PPS Coordinator.  Information including medical coding and functional independence measure will be reviewed and updated through discharge.    
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
## 2015-04-12 NOTE — Progress Notes (Signed)
[facility] PHYSICAL MEDICINE & REHABILITATION     PROGRESS NOTE    Subjective/Complaints: Had a reasonable night. Has headache and left arm pain. Ortho in room discussing options for left humerus. Has fair appetite. Family in room.  ROS: Pt denies fever, rash/itching,blurred or double vision, nausea, vomiting, abdominal pain, diarrhea, chest pain, shortness of breath, palpitations, dysuria, dizziness, bleeding, anxiety, or depression   Objective: Vital Signs: Blood pressure 109/65, pulse 74, temperature 98.2 F (36.8 C), temperature source Axillary, resp. rate 18, height 5\' 7"  (1.702 m), weight 65.545 kg (144 lb 8 oz), SpO2 97 %. Dg Humerus Left  04/11/2015  CLINICAL DATA:  Re-evaluate fracture angulation EXAM: LEFT HUMERUS - 2+ VIEW COMPARISON:  04/05/2015 FINDINGS: There is a comminuted fracture identified in the mid left humerus. There is again noted approximately 1 bone width displacement of the distal fracture fragment medially and anteriorly. The degree of angulation seen on the prior exam has reduced significantly in the interval. No callus formation is noted. IMPRESSION: Displacement of the distal bony fragment without significant angulation. Electronically Signed   By: Alcide CleverMark  Lukens M.D.   On: 04/11/2015 08:33    Recent Labs  04/12/15 0540  WBC 3.7*  HGB 13.5  HCT 39.3  PLT 182    Recent Labs  04/12/15 0540  NA 134*  K 4.5  CL 94*  GLUCOSE 103*  BUN 14  CREATININE 0.91  CALCIUM 9.8   CBG (last 3)  No results for input(s): GLUCAP in the last 72 hours.  Wt Readings from Last 3 Encounters:  04/11/15 65.545 kg (144 lb 8 oz)  04/08/15 70.9 kg (156 lb 4.9 oz)  04/16/14 68.947 kg (152 lb) (45 %*, Z = -0.12)   * Growth percentiles are based on CDC 2-20 Years data.    Physical Exam:  Constitutional: He is oriented to person, place, and time. He appears well-developed and well-nourished.  HENT:  Head: Normocephalic and atraumatic.  Dry scabs left cheek and mid  forehead.  Eyes: Conjunctivae and EOM are normal. Pupils are equal, round, and reactive to light.  Neck: Normal range of motion. Neck supple.  Shoulder/head discomfort with head turns to the left.  Cardiovascular: Normal rate and regular rhythm. Exam reveals no gallop and no friction rub.  No murmur heard. Respiratory: Effort normal and breath sounds normal. No respiratory distress. He has no wheezes. He has no rales.  GI: Soft. Bowel sounds are normal. He exhibits no distension. There is no tenderness. There is no rebound and no guarding.  Musculoskeletal: He exhibits tenderness (right posterior thigh and buttock.).  Right posterior thigh with edema medially due to contusion. LUE with Sarmiento brace and sling for immobilization--limb expectedly tender.  Neurological: He is alert and oriented to person, place, and time.  Continues to have flat affect but showed some animation.  He made eye contact and interacted during the exam. Able to follow simple motor commands without difficulty. Oriented to place, reason, month/year, name. Left upper extremity limited by ortho/harness. RUE: grossly 4/5. RLE: 2/5hf, 3-ke and ad/apf. LLE: 3/5 hf, 3+ke and 4/5 adf/apf.  Skin: Skin is warm and dry.  Small abrasion left forehead  Psychiatric: His affect is blunt.  Flat, cooperative. Follows simple commands.   Assessment/Plan: 1. 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 Physiatrist is providing close team supervision and 24 hour management of active medical problems listed below. Physiatrist and rehab  team continue to assess barriers to discharge/monitor patient progress toward functional and medical goals.  Function:  Bathing Bathing position      Bathing parts      Bathing assist        Upper Body Dressing/Undressing Upper body dressing                    Upper  body assist        Lower Body Dressing/Undressing Lower body dressing                                  Lower body assist        Toileting Toileting          Toileting assist     Transfers Chair/bed transfer             Locomotion Ambulation           Wheelchair          Cognition Comprehension    Expression    Social Interaction    Problem Solving    Memory     Medical Problem List and Plan: 1. Functional deficits secondary to TBI and polytrauma  -beginning therapies today  -family all seem appropriate and involved in process 2. DVT Prophylaxis/Anticoagulation: Mechanical: Sequential compression devices, below knee Bilateral lower extremities 3. Pain Management: continue oxycodone prn. Added robaxin to help with shoulder/neck pain. Ice for right thigh.   -encouraged patient/family to inform us if pain control is inadequate 4. Mood: LCSW to follow for evaluation and support.  5. Neuropsych: This patient is not capable of making decisions on his own behalf. 6. Skin/Wound Care: routine pressure relief measures.  7. Fluids/Electrolytes/Nutrition: Monitor I/O. Electrolytes all reviewed---sodium slightly low---otherwise normal 8. Displaced left humerus fracture: Sarmiento brace and sling for stabilization. NWB LUE  -ortho discussed options with family---may go for ORIF this weekend. Awaiting family decision  -reviewed xrays personally and spoke with orthopedic surgeon about plan 9. Headaches: Improving. Consider adding topamax if no improvement 10. ABLA: I personally reviewed the patient's labs today. hgb 13.5 today 11. Neutropenia: No signs of fever. Wbc 3.7 today 12. Asthma: Uses rescue inhaler prn. Respiratory status stable.     LOS (Days) 1 A FACE TO FACE EVALUATION WAS PERFORMED  SWARTZ,ZACHARY T 04/12/2015 8:58 AM

## 2015-04-12 NOTE — Progress Notes (Signed)
Patient ID: Kenneth Frank, male   DOB: 1996-04-17, 19 y.o.   MRN: [id number] I spoke with [name] and his family at the bedside and showed his left humerus x-rays to them.  I explained operative and non-operative treatments and the risks and benefits of both.  They will discuss this.  If surgery is chosen, then I will set it up for this weekend.  Will follow-up with them again in the next day or so.
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
## 2015-04-13 ENCOUNTER — Inpatient Hospital Stay (HOSPITAL_COMMUNITY): Payer: Managed Care, Other (non HMO) | Admitting: Occupational Therapy

## 2015-04-13 ENCOUNTER — Inpatient Hospital Stay (HOSPITAL_COMMUNITY): Payer: Managed Care, Other (non HMO) | Admitting: Speech Pathology

## 2015-04-13 ENCOUNTER — Inpatient Hospital Stay (HOSPITAL_COMMUNITY): Payer: Managed Care, Other (non HMO) | Admitting: Physical Therapy

## 2015-04-13 ENCOUNTER — Encounter (HOSPITAL_COMMUNITY): Payer: Managed Care, Other (non HMO)

## 2015-04-13 MED ORDER — SENNOSIDES-DOCUSATE SODIUM 8.6-50 MG PO TABS
2.0000 | ORAL_TABLET | Freq: Three times a day (TID) | ORAL | Status: DC
  Administered 2015-04-13 – 2015-04-14 (×5): 2 via ORAL
  Filled 2015-04-13 (×5): qty 2

## 2015-04-13 MED ORDER — SORBITOL 70 % SOLN
45.0000 mL | Freq: Once | Status: AC
  Administered 2015-04-13: 45 mL via ORAL
  Filled 2015-04-13: qty 60

## 2015-04-13 NOTE — Progress Notes (Signed)
Speech Language Pathology Daily Session Note  Patient Details  Name: Carolanne Grumblingaron Odenthal MRN: [id number] Date of Birth: February 22, 1996  Today's Date: 04/13/2015 SLP Individual Time: 1000-1100 SLP Individual Time Calculation (min): 60 min  Short Term Goals: Week 1: SLP Short Term Goal 1 (Week 1): Patient will complete mildly complex, functional tasks with Supervision verbal cues.  SLP Short Term Goal 2 (Week 1): Patient will recall new, daily information with supervision question cues.  SLP Short Term Goal 3 (Week 1): Patient will demonstrate selective attention to a functional task for 30 minutes in a mildly distracting enviornment with supervision verbal cues.  SLP Short Term Goal 4 (Week 1): Patient will demonstrate anticipatory awareness in regards to d/c planning with supervision question cues to identify activities/tasks he can complete/participate in safely.  SLP Short Term Goal 5 (Week 1): Patient will verball express his wants/needs with supervision question cues.  SLP Short Term Goal 6 (Week 1): Patient will demonstrate increased speech intelligibility at the sentence level to 90% with Min A verbal cues use of intelligibility strategies.   Skilled Therapeutic Interventions: Skilled treatment session focused on cognitive goals.  Upon arrival, patient was asleep in bed but easily awakened to voice. SLP facilitated session by taking the patient to the dayroom to maximize arousal and increase level of distraction. Patient participated in a basic money management task with Mod I and 100% accuracy. SLP also provided supervision verbal cues for problem solving during a complex, new learning task and completed the task in a timely manner.   Patient with increased social interaction and verbal expression but continues to demonstrate mildly impaired speech intelligibility due to minimal movement of his oral musculature, however, patient improved articulation with Min A verbal cues which increased his  intelligibility to 100% at the phrase level. Patient requested to use the bathroom and voided while standing with supervision. Patient left supine in bed with all needs within reach. Continue with current plan of care.    Function:  Cognition Comprehension Comprehension assist level: Understands basic 90% of the time/cues < 10% of the time  Expression   Expression assist level: Expresses basic 50 - 74% of the time/requires cueing 25 - 49% of the time. Needs to repeat parts of sentences.  Social Interaction Social Interaction assist level: Interacts appropriately 75 - 89% of the time - Needs redirection for appropriate language or to initiate interaction.  Problem Solving Problem solving assist level: Solves complex 90% of the time/cues < 10% of the time  Memory Memory assist level: Recognizes or recalls 90% of the time/requires cueing < 10% of the time    Pain Pain Assessment Pain Assessment: 0-10 Pain Score: 7  Pain Type: Acute pain Pain Location: Arm Pain Orientation: Left Pain Descriptors / Indicators: Aching Pain Frequency: Constant Pain Onset: On-going Pain Intervention(s): Medication (See eMAR)  Therapy/Group: Individual Therapy  Zameer Borman 04/13/2015, 3:44 PM

## 2015-04-13 NOTE — Progress Notes (Addendum)
Occupational Therapy Session Note  Patient Details  Name: Kenneth Frank MRN: [id number] Date of Birth: 03-21-1996  Today's Date: 04/13/2015 OT Individual Time: [id number] and 1300-1356 OT Individual Time Calculation (min): 54 min and 56 min   Short Term Goals: Week 1:  OT Short Term Goal 1 (Week 1): STG=LTGs secondary to estimated short LOS  Skilled Therapeutic Interventions/Progress Updates:  Session 1: Upon entering the room, pt easily roused from sleep. Pt with 6/10 c/o pain in L UE this session with brace and sling remaining donned. Pt declined shower again this session but requesting to wash at sink side with sit <>stand from wheelchair level. Pt requiring assistance to wash R UE this session secondary to L UE in sling. OT discussed possibility of utilizing AE to increase I with this task. Pt standing with steady assist while he washed buttocks and peri area. He showed good safety awareness by sitting to wash B lower legs and feet as well as to complete LB dressing. Pt seated in wheelchair at end of session with breakfast tray in front of him and call bell within reach.    Session 2: Upon entering the room, pt supine in bed and very lethargic. Very hard to arouse pt from sleep. OT utilizing cold wash cloth against pts face in order to increase alertness. Pt taking 10 minutes this session to open eyes and attend to sitting on EOB. Pt performed bed mobility with supervision and steady assist while seated on EOB to don B shocks with R hand. [facility] to decrease distractions. While ambulation pt rubbing buttocks and R hamstring and reports "it hurts". Pt reporting that pain does not increase with ambulation. Pt seated in gym secondary to fatigue. Pt preforms 2 moderately difficult designs with pipe tree. Instructions provided regarding sizes and difference between pipes and joints. Pt able to complete task with increased time for problem solving and min verbal guidance cues  this session. Pt returning to room at end of session in same way as stated above. Pt seated on EOB with mother present in the room. Call bell and all needed items within reach upon exiting the room.   Therapy Documentation Precautions:  Precautions Precautions: Fall Required Braces or Orthoses: Sling Restrictions Weight Bearing Restrictions: Yes LUE Weight Bearing: Non weight bearing General:   Vital Signs: Therapy Vitals Temp: 98.7 F (37.1 C) Temp Source: Oral Pulse Rate: 73 Resp: 19 BP: 109/71 mmHg Patient Position (if appropriate): Lying Oxygen Therapy SpO2: 99 % O2 Device: Not Delivered  See Function Navigator for Current Functional Status.   Therapy/Group: Individual Therapy  Lowella Gripittman, Hopelynn Gartland L 04/13/2015, 8:01 AM

## 2015-04-13 NOTE — Progress Notes (Signed)
Physical Therapy Session Note  Patient Details  Name: Kenneth Frank MRN: [id number] Date of Birth: January 07, 1996  Today's Date: 04/13/2015 PT Individual Time: [id number] PT Individual Time Calculation (min): 85 min   Short Term Goals: Week 1:  PT Short Term Goal 1 (Week 1): = LTGs due to anticipated LOS  Skilled Therapeutic Interventions/Progress Updates:   Patient asleep in bed, aroused to light touch and voice and sat edge of bed with increased time. Gait without AD x 200 ft with slow pace, patient scooting RLE along floor due to increased pain at site of hematoma on R posterior thigh/buttocks requiring cues for RLE clearance during swing phase. RN notified and meds administered. Supine on mat, performed repetitive RLE SLR, PROM R hamstring stretch, bridging x 20 followed by NuStep using BLE at level 3 x 10 min for muscle extensibility/ROM. Patient provided tennis ball to massage over hematoma. In stairwell, patient negotiated up/down flight of stairs using R rail with reciprocal pattern ascending and step-to pattern descending and supervision. Patient ambulated over uneven surface with supervision and attempted to pick up small object from floor but was unable due to increased RLE pain. Patient transported outdoors in wheelchair but declined further ambulation or standing tasks due to increased pain. Patient propelled wheelchair back to room using RUE and LLE with S-min A due to L foot barely reaching ground, discussed having family bring in tennis shoes. Patient left semi reclined in bed with disposable heat pack as he is still awaiting k-pad, family present.   Therapy Documentation Precautions:  Precautions Precautions: Fall Required Braces or Orthoses: Sling Restrictions Weight Bearing Restrictions: Yes LUE Weight Bearing: Non weight bearing Pain: Pain Assessment Pain Assessment: 0-10 Pain Score: 8  Pain Type: Acute pain Pain Location: Buttocks Pain Orientation: Right Pain Descriptors /  Indicators: Grimacing;Discomfort;Sore Pain Frequency: Constant Pain Onset: With Activity Pain Intervention(s): RN made aware;Repositioned;Emotional support;Massage;Ambulation/increased activity;Heat applied   See Function Navigator for Current Functional Status.   Therapy/Group: Individual Therapy  Kerney ElbeVarner, Amarea Macdowell A 04/13/2015, 5:10 PM

## 2015-04-13 NOTE — Progress Notes (Signed)
Pt c/o pain, PRN pain meds given as ordered, pt has flat affect, family at bedside, pt stable

## 2015-04-13 NOTE — Progress Notes (Signed)
[place] PHYSICAL MEDICINE & REHABILITATION     PROGRESS NOTE    Subjective/Complaints: Headaches getting better. Left arm still sore. Family deciding on decision for surgery or not  ROS: Pt denies fever, rash/itching,blurred or double vision, nausea, vomiting, abdominal pain, diarrhea, chest pain, shortness of breath, palpitations, dysuria, dizziness, bleeding, anxiety, or depression   Objective: Vital Signs: Blood pressure 109/71, pulse 73, temperature 98.7 F (37.1 C), temperature source Oral, resp. rate 19, height 5\' 7"  (1.702 m), weight 65.545 kg (144 lb 8 oz), SpO2 99 %. No results found.  Recent Labs  04/12/15 0540  WBC 3.7*  HGB 13.5  HCT 39.3  PLT 182    Recent Labs  04/12/15 0540  NA 134*  K 4.5  CL 94*  GLUCOSE 103*  BUN 14  CREATININE 0.91  CALCIUM 9.8   CBG (last 3)  No results for input(s): GLUCAP in the last 72 hours.  Wt Readings from Last 3 Encounters:  04/11/15 65.545 kg (144 lb 8 oz)  04/08/15 70.9 kg (156 lb 4.9 oz)  04/16/14 68.947 kg (152 lb) (45 %*, Z = -0.12)   * Growth percentiles are based on CDC 2-20 Years data.    Physical Exam:  Constitutional: He is oriented to person, place, and time. He appears well-developed and well-nourished.  HENT:  Head: Normocephalic and atraumatic.  Dry scabs left cheek and mid forehead.  Eyes: Conjunctivae and EOM are normal. Pupils are equal, round, and reactive to light.  Neck: Normal range of motion. Neck supple.  Shoulder/head discomfort with head turns to the left.  Cardiovascular: Normal rate and regular rhythm. Exam reveals no gallop and no friction rub.  No murmur heard. Respiratory: Effort normal and breath sounds normal. No respiratory distress. He has no wheezes. He has no rales.  GI: Soft. Bowel sounds are normal. He exhibits no distension. There is no tenderness. There is no rebound and no guarding.  Musculoskeletal: He exhibits tenderness (right posterior thigh and buttock.).   Right posterior thigh with edema medially due to contusion. LUE with Sarmiento brace and sling for immobilization  Neurological: He is alert and oriented to person, place, and time.  Continues to have flat affect.  He made eye contact and interacted during the exam. Able to follow simple motor commands without difficulty. Oriented to place, reason, month/year, name. Left upper extremity limited by ortho/harness. RUE: grossly 4/5. RLE: 2/5hf, 3-ke and ad/apf. LLE: 3/5 hf, 3+ke and 4/5 adf/apf.  Skin: Skin is warm and dry.  Small abrasion left forehead gone. Psychiatric: His affect is blunt.  Flat, cooperative. Follows simple commands.   Assessment/Plan: 1. 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 Physiatrist is providing close team supervision and 24 hour management of active medical problems listed below. Physiatrist and rehab team continue to assess barriers to discharge/monitor patient progress toward functional and medical goals.  Function:  Bathing Bathing position   Position: Wheelchair/chair at sink  Bathing parts Body parts bathed by patient: Chest, Abdomen, Front perineal area, Buttocks, Right upper leg, Left upper leg, Right lower leg, Left lower leg Body parts bathed by helper: Right arm, Back  Bathing assist Assist Level: Touching or steadying assistance(Pt > 75%)      Upper Body Dressing/Undressing Upper body dressing   What is the patient wearing?: Hospital gown                Upper body  assist        Lower Body Dressing/Undressing Lower body dressing   What is the patient wearing?: Pants, Underwear, Non-skid slipper socks Underwear - Performed by patient: Thread/unthread left underwear leg, Pull underwear up/down, Thread/unthread right underwear leg Underwear - Performed by helper: Thread/unthread right underwear leg Pants- Performed by  patient: Thread/unthread left pants leg, Pull pants up/down, Thread/unthread right pants leg Pants- Performed by helper: Thread/unthread right pants leg Non-skid slipper socks- Performed by patient: Don/doff right sock, Don/doff left sock                    Lower body assist Assist for lower body dressing: Touching or steadying assistance (Pt > 75%)      Toileting Toileting   Toileting steps completed by patient: Adjust clothing prior to toileting, Performs perineal hygiene, Adjust clothing after toileting      Toileting assist Assist level: Supervision or verbal cues   Transfers Chair/bed transfer   Chair/bed transfer method: Ambulatory Chair/bed transfer assist level: Supervision or verbal cues Chair/bed transfer assistive device: Armrests     Locomotion Ambulation     Max distance: 85 ft Assist level: Supervision or verbal cues   Wheelchair Wheelchair activity did not occur: Safety/medical concerns     Assist Level: Dependent (Pt equals 0%)  Cognition Comprehension Comprehension assist level: Understands basic 90% of the time/cues < 10% of the time  Expression Expression assist level: Expresses basic 25 - 49% of the time/requires cueing 50 - 75% of the time. Uses single words/gestures.  Social Interaction Social Interaction assist level: Interacts appropriately 50 - 74% of the time - May be physically or verbally inappropriate.  Problem Solving Problem solving assist level: Solves basic 75 - 89% of the time/requires cueing 10 - 24% of the time  Memory Memory assist level: Recognizes or recalls 75 - 89% of the time/requires cueing 10 - 24% of the time   Medical Problem List and Plan: 1. Functional deficits secondary to TBI and polytrauma  -continue CIR 2. DVT Prophylaxis/Anticoagulation: Mechanical: Sequential compression devices, below knee Bilateral lower extremities 3. Pain Management: continue oxycodone prn. Added robaxin to help with shoulder/neck pain. Ice  for right thigh.   -headaches improving 4. Mood: LCSW to follow for evaluation and support.  5. Neuropsych: This patient is not capable of making decisions on his own behalf. 6. Skin/Wound Care: routine pressure relief measures.  7. Fluids/Electrolytes/Nutrition: Monitor I/O. Electrolytes all reviewed---sodium slightly low---otherwise normal 8. Displaced left humerus fracture: Sarmiento brace and sling for stabilization. NWB LUE  -ortho discussed options with family---may go for ORIF this weekend.   -family still deciding upon plan--further education provided today 9. Headaches: Improving. Consider adding topamax if no improvement 10. ABLA: I personally reviewed the patient's labs today.   11. Neutropenia: No signs of fever.   12. Asthma: Uses rescue inhaler prn. Respiratory status stable.     LOS (Days) 2 A FACE TO FACE EVALUATION WAS PERFORMED  Meleah Demeyer T 04/13/2015 9:06 AM

## 2015-04-14 ENCOUNTER — Inpatient Hospital Stay (HOSPITAL_COMMUNITY): Payer: Managed Care, Other (non HMO) | Admitting: Speech Pathology

## 2015-04-14 ENCOUNTER — Inpatient Hospital Stay (HOSPITAL_COMMUNITY): Payer: Managed Care, Other (non HMO)

## 2015-04-14 ENCOUNTER — Inpatient Hospital Stay (HOSPITAL_COMMUNITY): Payer: Managed Care, Other (non HMO) | Admitting: Physical Therapy

## 2015-04-14 ENCOUNTER — Inpatient Hospital Stay (HOSPITAL_COMMUNITY): Payer: Managed Care, Other (non HMO) | Admitting: Occupational Therapy

## 2015-04-14 MED ORDER — MAGNESIUM CITRATE PO SOLN
1.0000 | Freq: Once | ORAL | Status: AC
  Administered 2015-04-14: 1 via ORAL
  Filled 2015-04-14: qty 296

## 2015-04-14 NOTE — Progress Notes (Signed)
Speech Language Pathology Daily Session Note  Patient Details  Name: Kenneth Frank MRN: [id number] Date of Birth: 1995/07/26  Today's Date: 04/14/2015 SLP Individual Time: 1533-1600 SLP Individual Time Calculation (min): 27 min  Short Term Goals: Week 1: SLP Short Term Goal 1 (Week 1): Patient will complete mildly complex, functional tasks with Supervision verbal cues.  SLP Short Term Goal 2 (Week 1): Patient will recall new, daily information with supervision question cues.  SLP Short Term Goal 3 (Week 1): Patient will demonstrate selective attention to a functional task for 30 minutes in a mildly distracting enviornment with supervision verbal cues.  SLP Short Term Goal 4 (Week 1): Patient will demonstrate anticipatory awareness in regards to d/c planning with supervision question cues to identify activities/tasks he can complete/participate in safely.  SLP Short Term Goal 5 (Week 1): Patient will verball express his wants/needs with supervision question cues.  SLP Short Term Goal 6 (Week 1): Patient will demonstrate increased speech intelligibility at the sentence level to 90% with Min A verbal cues use of intelligibility strategies.   Skilled Therapeutic Interventions: Skilled treatment session focused on addressing communication goals. SLP facilitated session by providing re-education regarding use of over articulation as a means to increase speech intelligibility.  Patient then required Min increased to Mod question cues to self-monitor and correct for ~90% accuracy at the phrase level in an environment with mild background noise. Session ended with SLP handing patient off to next therapist, OT.  Continue with current plan of care.    Function:   Cognition Comprehension Comprehension assist level: Understands basic 90% of the time/cues < 10% of the time  Expression   Expression assist level: Expresses basic 50 - 74% of the time/requires cueing 25 - 49% of the time. Needs to repeat parts  of sentences.  Social Interaction Social Interaction assist level: Interacts appropriately 75 - 89% of the time - Needs redirection for appropriate language or to initiate interaction.  Problem Solving Problem solving assist level: Solves complex 90% of the time/cues < 10% of the time  Memory Memory assist level: Recognizes or recalls 90% of the time/requires cueing < 10% of the time    Pain Pain Assessment Pain Assessment: 0-10 Pain Score: 7  Pain Type: Acute pain Pain Location: Head Pain Orientation: Posterior Pain Descriptors / Indicators: Aching Pain Onset: On-going Pain Intervention(s): Repositioned;RN made aware Multiple Pain Sites: No  Therapy/Group: Individual Therapy  Charlane FerrettiMelissa Kyrra Prada, M.A., CCC-SLP [phone]  [name] 04/14/2015, 4:18 PM

## 2015-04-14 NOTE — Progress Notes (Signed)
Occupational Therapy Note  Patient Details  Name: Kenneth Frank MRN: [id number] Date of Birth: 1996-02-18  Today's Date: 04/14/2015 OT Individual Time: 1030-1100 OT Individual Time Calculation (min): 30 min   Pt c/o increased pain in RLE (hamstring/gluteal) with activity, unrated, kinesio tape applied Individual Therapy  Pt amb without AD to BI therapy gym and stood at hi-lo table for table activities while kinesio tape applied.  Focus on following one step commands, activity tolerance, functional amb without AD, attention to task, and safety awareness.Pt followed one step commands throughout session.   Kenneth Frank, Kenneth Frank Sent[facility] Bayside HospitalChappell 04/14/2015, 12:11 PM

## 2015-04-14 NOTE — Progress Notes (Signed)
Speech Language Pathology Daily Session Note  Patient Details  Name: Kenneth Frank MRN: [id number] Date of Birth: 1996/03/06  Today's Date: 04/14/2015 SLP Individual Time: 1100-1130 SLP Individual Time Calculation (min): 30 min  Short Term Goals: Week 1: SLP Short Term Goal 1 (Week 1): Patient will complete mildly complex, functional tasks with Supervision verbal cues.  SLP Short Term Goal 2 (Week 1): Patient will recall new, daily information with supervision question cues.  SLP Short Term Goal 3 (Week 1): Patient will demonstrate selective attention to a functional task for 30 minutes in a mildly distracting enviornment with supervision verbal cues.  SLP Short Term Goal 4 (Week 1): Patient will demonstrate anticipatory awareness in regards to d/c planning with supervision question cues to identify activities/tasks he can complete/participate in safely.  SLP Short Term Goal 5 (Week 1): Patient will verball express his wants/needs with supervision question cues.  SLP Short Term Goal 6 (Week 1): Patient will demonstrate increased speech intelligibility at the sentence level to 90% with Min A verbal cues use of intelligibility strategies.   Skilled Therapeutic Interventions: Skilled treatment session focused on cognitive goals. Patient appeared lethargic throughout the session and required intermittent encouragement for arousal. SLP facilitated session by providing Mod A verbal cues for patient to be ~75% intelligible at the word and phrase level in regards to utilization of speech intelligibility strategies.  Patient also participated in a visually distracting, processing task and completed the task with extra time and demonstrated sustained attention to task for ~15 minutes with Min A verbal cues. Patient was transferred back to bed at end of session with supervision. Patient left supine in bed with family present. Continue with current plan of care.    Function:   Cognition Comprehension  Comprehension assist level: Understands basic 90% of the time/cues < 10% of the time  Expression   Expression assist level: Expresses basic 50 - 74% of the time/requires cueing 25 - 49% of the time. Needs to repeat parts of sentences.  Social Interaction Social Interaction assist level: Interacts appropriately 75 - 89% of the time - Needs redirection for appropriate language or to initiate interaction.  Problem Solving Problem solving assist level: Solves complex 90% of the time/cues < 10% of the time  Memory Memory assist level: Recognizes or recalls 90% of the time/requires cueing < 10% of the time    Pain Pain Assessment Pain Assessment: Faces Faces Pain Scale: Hurts even more Pain Intervention(s): Cold applied;Emotional support  Therapy/Group: Individual Therapy  Ester Hilley 04/14/2015, 12:13 PM

## 2015-04-14 NOTE — Progress Notes (Signed)
Physical Therapy Session Note  Patient Details  Name: Kenneth Frank MRN: [id number] Date of Birth: April 07, 1996  Today's Date: 04/14/2015 PT Individual Time: 0905-1002 PT Individual Time Calculation (min): 57 min   Short Term Goals: Week 1:  PT Short Term Goal 1 (Week 1): = LTGs due to anticipated LOS  Skilled Therapeutic Interventions/Progress Updates:    Pt received resting in w/c and agreeable to therapy session.  Session focus on improving gait pattern, pain control, stretching, and stair negotiation.  PT instructed patient in amb to therapy gym with no AD and close supervision with verbal cues for step length and increased hip/knee flexion on RLE during swing phase.  Pt positioned in supine on therapy mat and PT engaged patient in RLE stretching to reduce pain in proximal 2/3 of hamstrings.  Pt performed 3 sets of 45 second hold for glute stretch and hamstring stretch with cold pack positioned underneath glutes for increased comfort.  PT instructed patient in stair negotiation x3 flights of stairs in stairwell with R hand rail.  Pt requires close supervision to ascend and steady assist to descend with verbal cues for neutral positioning of RLE (pt initially placing foot on step in IR position during descent).  Pt amb back to room and sling repositioned slightly for increased support of LUE.  Pt positioned supine in bed with cold pack underneath glutes. Call bell in reach and needs met.   Therapy Documentation Precautions:  Precautions Precautions: Fall Required Braces or Orthoses: Sling Restrictions Weight Bearing Restrictions: Yes LUE Weight Bearing: Non weight bearing Pain: Pain Assessment Pain Assessment: Faces Faces Pain Scale: Hurts even more Pain Descriptors / Indicators: Grimacing Pain Intervention(s): Cold applied;Emotional support   See Function Navigator for Current Functional Status.   Therapy/Group: Individual Therapy  Dona Walby E Penven-Crew 04/14/2015, 10:56 AM

## 2015-04-14 NOTE — Progress Notes (Signed)
Occupational Therapy Session Note  Patient Details  Name: Carolanne Grumblingaron Cizek MRN: [id number] Date of Birth: July 23, 1995  Today's Date: 04/14/2015 OT Individual Time: 0800-0900 OT Individual Time Calculation (min): 60 min    Short Term Goals: Week 1:  OT Short Term Goal 1 (Week 1): STG=LTGs secondary to estimated short LOS  Skilled Therapeutic Interventions/Progress Updates:    Pt resting in bed upon arrival and beginning to eat breakfast.  Pt was able to self feed with set-up.  Pt engaged in BADL retraining including bathing and dressing with sit<>stand from seat.  Pt required assistance to bathe RUE.  Pt is not wearing shirt at this time secondary to Lt shoulder immobilizer.  Pt completed all other bathing tasks and LB dressing tasks with supervision.  Pt requires more than a reasonable amount of time to complete tasks.  Pt stood at sink for application of Kinesio tape to Rt hamstring and glutes. Focus on activity tolerance, task initiation, sequencing, standing balance, and safety awareness. Therapy Documentation Precautions:  Precautions Precautions: Fall Required Braces or Orthoses: Sling Restrictions Weight Bearing Restrictions: Yes LUE Weight Bearing: Non weight bearing Pain: Pain Assessment Pain Assessment: 0-10 Pain Score: 8  Pain Type: Acute pain Pain Location: Arm Pain Orientation: Left Pain Descriptors / Indicators: Grimacing;Sore Pain Frequency: Occasional Pain Onset: Gradual Patients Stated Pain Goal: 3 Pain Intervention(s): Repositioned    See Function Navigator for Current Functional Status.   Therapy/Group: Individual Therapy  Rich BraveLanier, Kierstan Auer Chappell 04/14/2015, 9:03 AM

## 2015-04-14 NOTE — IPOC Note (Signed)
Overall Plan of Care [facility]) Patient Details Name: Kenneth Frank MRN: [id number] DOB: [birth year]  Admitting Diagnosis: TBI  Hospital Problems: Principal Problem:   Traumatic brain injury without loss of consciousness (HCC) Active Problems:   Temporal bone fracture (HCC)   Left humeral fracture   Acute blood loss anemia     Functional Problem List: Nursing Endurance, Safety, Skin Integrity  PT Balance, Endurance, Motor, Nutrition, Pain, Safety  OT Balance, Behavior, Endurance, Motor, Pain, Safety  SLP Cognition  TR         Basic ADL's: OT Grooming, Bathing, Dressing, Toileting, Eating     Advanced  ADL's: OT Simple Meal Preparation, Laundry     Transfers: PT Bed Mobility, Bed to Chair, Set designer, [employer], Floor  OT Tub/Shower     Locomotion: PT Ambulation, Stairs, Psychologist, prison and probation services     Additional Impairments: OT None  SLP Communication, Social Cognition expression Social Interaction, Problem Solving, Memory, Attention, Awareness  TR      Anticipated Outcomes Item Anticipated Outcome  Self Feeding Mod I   Swallowing      Basic self-care  Mod I   Toileting  Mod I    Bathroom Transfers shower- supervision , toilet - mod I   Bowel/Bladder  continue bowel /bladder   Transfers  mod I  Locomotion  mod I  Communication  Supervision   Cognition  Supervision   Pain  less than 3, pain regiment as needed.   Safety/Judgment  free from fall while stay in rehab.    Therapy Plan: PT Intensity: Minimum of 1-2 x/day ,45 to 90 minutes PT Frequency: 5 out of 7 days PT Duration Estimated Length of Stay: 7-10 days OT Intensity: Minimum of 1-2 x/day, 45 to 90 minutes OT Frequency: 5 out of 7 days OT Duration/Estimated Length of Stay: 7-10 days SLP Intensity: Minumum of 1-2 x/day, 30 to 90 minutes SLP Frequency: 3 to 5 out of 7 days SLP Duration/Estimated Length of Stay: 7-10 days        Team Interventions: Nursing Interventions Patient/Family Education, Pain  Management  PT interventions Ambulation/gait training, Warden/ranger, Cognitive remediation/compensation, Firefighter, Discharge planning, DME/adaptive equipment instruction, Disease management/prevention, Functional mobility training, Neuromuscular re-education, Pain management, Patient/family education, Psychosocial support, Stair training, Therapeutic Activities, Therapeutic Exercise, UE/LE Coordination activities, UE/LE Strength taining/ROM  OT Interventions Warden/ranger, Firefighter, Equities trader education, Self Care/advanced ADL retraining, Therapeutic Exercise, UE/LE Coordination activities, Discharge planning, DME/adaptive equipment instruction, Functional mobility training, Pain management, Psychosocial support, Therapeutic Activities, UE/LE Strength taining/ROM  SLP Interventions Cognitive remediation/compensation, Cueing hierarchy, Functional tasks, Patient/family education, Therapeutic Activities, Speech/Language facilitation, Internal/external aids, Environmental controls  TR Interventions    SW/CM Interventions Discharge Planning, Psychosocial Support, Patient/Family Education    Team Discharge Planning: Destination: PT-Home ,OT- Home , SLP-Home Projected Follow-up: PT-Outpatient PT, OT-  Outpatient OT, Other (comment) (intermittent supervision), SLP-24 hour supervision/assistance, Outpatient SLP Projected Equipment Needs: PT-To be determined, OT- Tub/shower bench, SLP-None recommended by SLP Equipment Details: PT- , OT-  Patient/family involved in discharge planning: PT- Family member/caregiver, Patient,  OT-Patient, Family member/caregiver, SLP-Patient, Family member/caregiver  MD ELOS: 7-10 days**(see below) Medical Rehab Prognosis:  Excellent Assessment: The patient has been admitted for CIR therapies with the diagnosis of TBI/polytrauma. The team will be addressing functional mobility, strength, stamina, balance, safety,  adaptive techniques and equipment, self-care, bowel and bladder mgt, patient and caregiver education, pain mgt, ortho precautions, family ed, discharge planning. Goals have been set at mod I for mobility, self-care, ADL's, cognition, behavior. Stay will  be delayed however due to now planned ORIF of left humerus fx which will delay LOS by 2-3 days. Ranelle Oyster.    Zachary T. Swartz, MD, FAAPMR      See Team Conference Notes for weekly updates to the plan of care

## 2015-04-14 NOTE — Progress Notes (Signed)
Occupational Therapy Session Note  Patient Details  Name: Kenneth Frank MRN: [id number] Date of Birth: 07-Apr-1996  Today's Date: 04/14/2015 OT Individual Time:  -    1600-1700  (60 min)       Short Term Goals: Week 1:  OT Short Term Goal 1 (Week 1): STG=LTGs secondary to estimated short LOS    Skilled Therapeutic Interventions/Progress Updates:    Pt received resting in w/c and agreeable to therapy session. Session focus on RLE myotherapy, and RLE stretching to reduce pain in proximal hamstrings. Did hamstring stretch with cold pack positioned underneath glutes for increased comfort. Pt amb back to room and left in wc with all needs in place.  Pt positioned supine in bed   Therapy Documentation Precautions:  Precautions Precautions: Fall Required Braces or Orthoses: Sling Restrictions Weight Bearing Restrictions: Yes LUE Weight Bearing: Non weight bearing    Pain:  5/10 in left arm 7/10; head 7/10; right leg hamstrings, glutes ( went down to 5/10 after ice pack)             See Function Navigator for Current Functional Status.   Therapy/Group: Individual Therapy  [name], [name] 04/14/2015, 4:10 PM

## 2015-04-14 NOTE — Plan of Care (Signed)
Problem: RH BOWEL ELIMINATION Goal: RH STG MANAGE BOWEL WITH ASSISTANCE STG Manage Bowel with Min.Assistance.  Outcome: Not Progressing  Last BM  04/04/15 .Received sorbitol yesterday and on senna multiple times daily.Continue to monitor.Left noted for MD.

## 2015-04-14 NOTE — Progress Notes (Signed)
[place] PHYSICAL MEDICINE & REHABILITATION     PROGRESS NOTE    Subjective/Complaints: Left arm very sore, so is right buttock. Felt a "pop" in left arm when moving in bed last night. +constipation  ROS: Pt denies fever, rash/itching,blurred or double vision, nausea, vomiting, abdominal pain, diarrhea, chest pain, shortness of breath, palpitations, dysuria, dizziness, bleeding, anxiety, or depression   Objective: Vital Signs: Blood pressure 112/65, pulse 79, temperature 98.7 F (37.1 C), temperature source Oral, resp. rate 18, height  (1.702 m), weight 65.545 kg (144 lb 8 oz), SpO2 100 %. No results found.  Recent Labs  04/12/15 0540  WBC 3.7*  HGB 13.5  HCT 39.3  PLT 182    Recent Labs  04/12/15 0540  NA 134*  K 4.5  CL 94*  GLUCOSE 103*  BUN 14  CREATININE 0.91  CALCIUM 9.8   CBG (last 3)  No results for input(s): GLUCAP in the last 72 hours.  Wt Readings from Last 3 Encounters:  04/11/15 65.545 kg (144 lb 8 oz)  04/08/15 70.9 kg (156 lb 4.9 oz)  04/16/14 68.947 kg (152 lb) (45 %*, Z = -0.12)   * Growth percentiles are based on CDC 2-20 Years data.    Physical Exam:  Constitutional: He is oriented to person, place, and time. He appears well-developed and well-nourished.  HENT:  Head: Normocephalic and atraumatic.     Eyes: Conjunctivae and EOM are normal. Pupils are equal, round, and reactive to light.  Neck:  . Neck supple.    Cardiovascular: Normal rate and regular rhythm. Exam reveals no gallop and no friction rub.  No murmur heard. Respiratory: Effort normal and breath sounds normal. No respiratory distress. He has no wheezes. He has no rales.  GI: Soft. Bowel sounds are normal. He exhibits no distension. There is no tenderness. There is no rebound and no guarding.  Musculoskeletal: He exhibits tenderness (right posterior thigh and buttock, LUE).  Right posterior thigh with edema medially due to contusion. LUE with Sarmiento brace and  sling for immobilization  Neurological: He is alert and oriented to person, place, and time.  Continues to have flat affect.  He made eye contact and interacted during the exam. Able to follow simple motor commands without difficulty. Oriented to place, reason, month/year, name. Left upper extremity limited by ortho/harness. RUE: grossly 4/5. RLE: 2/5hf, 3-ke and ad/apf. LLE: 3/5 hf, 3+ke and 4/5 adf/apf.  Skin: Skin is warm and dry.  Small abrasion left forehead gone. Psychiatric: His affect is blunt.  Flat, cooperative. Follows simple commands.   Assessment/Plan: 1. 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 Physiatrist is providing close team supervision and 24 hour management of active medical problems listed below. Physiatrist and rehab team continue to assess barriers to discharge/monitor patient progress toward functional and medical goals.  Function:  Bathing Bathing position   Position: Wheelchair/chair at sink  Bathing parts Body parts bathed by patient: Chest, Abdomen, Front perineal area, Buttocks, Right upper leg, Left upper leg, Right lower leg, Left lower leg Body parts bathed by helper: Right arm, Back  Bathing assist Assist Level: Touching or steadying assistance(Pt > 75%)      Upper Body Dressing/Undressing Upper body dressing   What is the patient wearing?: Hospital gown                Upper body assist  Lower Body Dressing/Undressing Lower body dressing   What is the patient wearing?: Pants, Underwear, Non-skid slipper socks Underwear - Performed by patient: Thread/unthread left underwear leg, Pull underwear up/down, Thread/unthread right underwear leg Underwear - Performed by helper: Thread/unthread right underwear leg Pants- Performed by patient: Thread/unthread left pants leg, Pull pants up/down, Thread/unthread right pants  leg Pants- Performed by helper: Thread/unthread right pants leg Non-skid slipper socks- Performed by patient: Don/doff right sock, Don/doff left sock                    Lower body assist Assist for lower body dressing: Touching or steadying assistance (Pt > 75%)      Toileting Toileting   Toileting steps completed by patient: Adjust clothing prior to toileting, Performs perineal hygiene, Adjust clothing after toileting      Toileting assist Assist level: Supervision or verbal cues   Transfers Chair/bed transfer   Chair/bed transfer method: Ambulatory Chair/bed transfer assist level: Supervision or verbal cues Chair/bed transfer assistive device: Armrests     Locomotion Ambulation     Max distance: 200 Assist level: Supervision or verbal cues   Wheelchair Wheelchair activity did not occur: Safety/medical concerns Type: Manual Max wheelchair distance: 150 Assist Level: Touching or steadying assistance (Pt > 75%)  Cognition Comprehension Comprehension assist level: Understands basic 90% of the time/cues < 10% of the time  Expression Expression assist level: Expresses basic 50 - 74% of the time/requires cueing 25 - 49% of the time. Needs to repeat parts of sentences.  Social Interaction Social Interaction assist level: Interacts appropriately 75 - 89% of the time - Needs redirection for appropriate language or to initiate interaction.  Problem Solving Problem solving assist level: Solves complex 90% of the time/cues < 10% of the time  Memory Memory assist level: Recognizes or recalls 90% of the time/requires cueing < 10% of the time   Medical Problem List and Plan: 1. Functional deficits secondary to TBI and polytrauma  -continue CIR  -discharge tomorrow for surgery 2. DVT Prophylaxis/Anticoagulation: Mechanical: Sequential compression devices, below knee Bilateral lower extremities 3. Pain Management: continue oxycodone prn. Added robaxin to help with shoulder/neck  pain. Ice for right thigh.   -headaches improving  -immobilize left arm as able 4. Mood: LCSW to follow for evaluation and support.  5. Neuropsych: This patient is not capable of making decisions on his own behalf. 6. Skin/Wound Care: routine pressure relief measures.  7. Fluids/Electrolytes/Nutrition: Monitor I/O. Electrolytes all reviewed---sodium slightly low---otherwise normal 8. Displaced left humerus fracture: Sarmiento brace and sling for stabilization. NWB LUE  -ortho discussed options with family---plan is for ORIF of humerus tomorrow?.     9. Headaches: Improving. Consider adding topamax if no improvement 10. ABLA: I personally reviewed the patient's labs today.   11. Neutropenia: No signs of fever.   12. Asthma: Uses rescue inhaler prn. Respiratory status stable.  13. Constipation: mag citrate today---enema if no bm    LOS (Days) 3 A FACE TO FACE EVALUATION WAS PERFORMED  Haylei Cobin T 04/14/2015 9:09 AM

## 2015-04-14 NOTE — Care Management Note (Signed)
Inpatient Rehabilitation Center Individual Statement of Services  Patient Name:  Kenneth Frank  Date:  04/14/2015  Welcome to the Inpatient Rehabilitation Center.  Our goal is to provide you with an individualized program based on your diagnosis and situation, designed to meet your specific needs.  With this comprehensive rehabilitation program, you will be expected to participate in at least 3 hours of rehabilitation therapies Monday-Friday, with modified therapy programming on the weekends.  Your rehabilitation program will include the following services:  Physical Therapy (PT), Occupational Therapy (OT), Speech Therapy (ST), 24 hour per day rehabilitation nursing, Therapeutic Recreaction (TR), Neuropsychology, Case Management (Social Worker), Rehabilitation Medicine, Nutrition Services and Pharmacy Services  Weekly team conferences will be held on Tuesdays to discuss your progress.  Your Social Worker will talk with you frequently to get your input and to update you on team discussions.  Team conferences with you and your family in attendance may also be held.  Expected length of stay: 7-10 days  Overall anticipated outcome: supervision  Depending on your progress and recovery, your program may change. Your Social Worker will coordinate services and will keep you informed of any changes. Your Social Worker's name and contact numbers are listed  below.  The following services may also be recommended but are not provided by the Inpatient Rehabilitation Center:   Driving Evaluations  Home Health Rehabiltiation Services  Outpatient Rehabilitation Services  Vocational Rehabilitation  Student Support Services (via [employer])   Arrangements will be made to provide these services after discharge if needed.  Arrangements include referral to agencies that provide these services.  Your insurance has been verified to be:  Community education officerAetna Your primary doctor is:  None (family to establish)  Pertinent information  will be shared with your doctor and your insurance company.  Social Worker:  [name], [place] [phone] or [phone]) [phone]   Information discussed with and copy given to patient by: [name], [name], 04/14/2015, 2:10 PM
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
## 2015-04-14 NOTE — Progress Notes (Signed)
Patient ID: Kenneth Frank, male   DOB: 11-13-1995, 19 y.o.   MRN: [id number] I spoke with [name] and his family at the bedside late yesterday.  He and they do feel that surgery on his left humerus is indicated at this point given his severe discomfort.  Will plan on surgery sometime tomorrow.  NPO after midnight tonight.

## 2015-04-15 ENCOUNTER — Inpatient Hospital Stay (HOSPITAL_COMMUNITY)
Admission: RE | Admit: 2015-04-15 | Payer: Managed Care, Other (non HMO) | Source: Ambulatory Visit | Admitting: Orthopaedic Surgery

## 2015-04-15 ENCOUNTER — Inpatient Hospital Stay (HOSPITAL_COMMUNITY): Payer: Managed Care, Other (non HMO) | Admitting: Anesthesiology

## 2015-04-15 ENCOUNTER — Encounter (HOSPITAL_COMMUNITY)
Admission: AD | Disposition: A | Discharge status: Home / Self Care | Payer: Self-pay | Source: Intra-hospital | Attending: Physical Medicine & Rehabilitation

## 2015-04-15 ENCOUNTER — Inpatient Hospital Stay (HOSPITAL_COMMUNITY): Payer: Managed Care, Other (non HMO)

## 2015-04-15 ENCOUNTER — Inpatient Hospital Stay (HOSPITAL_COMMUNITY)
Admission: AD | Admit: 2015-04-15 | Discharge: 2015-04-18 | DRG: 494 | Disposition: A | Discharge status: Home / Self Care | Payer: Managed Care, Other (non HMO) | Source: Other Acute Inpatient Hospital | Attending: Orthopaedic Surgery | Admitting: Orthopaedic Surgery

## 2015-04-15 ENCOUNTER — Encounter (HOSPITAL_COMMUNITY): Payer: Self-pay | Admitting: Certified Registered Nurse Anesthetist

## 2015-04-15 DIAGNOSIS — S064X9D Epidural hemorrhage with loss of consciousness of unspecified duration, subsequent encounter: Secondary | ICD-10-CM | POA: Diagnosis not present

## 2015-04-15 DIAGNOSIS — S069X0A Unspecified intracranial injury without loss of consciousness, initial encounter: Secondary | ICD-10-CM | POA: Diagnosis present

## 2015-04-15 DIAGNOSIS — S42352D Displaced comminuted fracture of shaft of humerus, left arm, subsequent encounter for fracture with routine healing: Secondary | ICD-10-CM

## 2015-04-15 DIAGNOSIS — S42352A Displaced comminuted fracture of shaft of humerus, left arm, initial encounter for closed fracture: Principal | ICD-10-CM | POA: Diagnosis present

## 2015-04-15 DIAGNOSIS — S42302D Unspecified fracture of shaft of humerus, left arm, subsequent encounter for fracture with routine healing: Secondary | ICD-10-CM | POA: Diagnosis not present

## 2015-04-15 DIAGNOSIS — M25551 Pain in right hip: Secondary | ICD-10-CM | POA: Diagnosis present

## 2015-04-15 DIAGNOSIS — S42302A Unspecified fracture of shaft of humerus, left arm, initial encounter for closed fracture: Secondary | ICD-10-CM | POA: Diagnosis present

## 2015-04-15 HISTORY — PX: ORIF HUMERUS FRACTURE: SHX2126

## 2015-04-15 SURGERY — OPEN REDUCTION INTERNAL FIXATION (ORIF) HUMERAL SHAFT FRACTURE
Anesthesia: General | Laterality: Left

## 2015-04-15 MED ORDER — CEFAZOLIN SODIUM-DEXTROSE 2-3 GM-% IV SOLR
INTRAVENOUS | Status: DC | PRN
  Administered 2015-04-15: 2 g via INTRAVENOUS

## 2015-04-15 MED ORDER — OXYCODONE HCL 5 MG PO TABS
5.0000 mg | ORAL_TABLET | ORAL | Status: DC | PRN
  Administered 2015-04-15 – 2015-04-18 (×14): 10 mg via ORAL
  Filled 2015-04-15 (×15): qty 2

## 2015-04-15 MED ORDER — NEOSTIGMINE METHYLSULFATE 10 MG/10ML IV SOLN
INTRAVENOUS | Status: DC | PRN
  Administered 2015-04-15: 4 mg via INTRAVENOUS

## 2015-04-15 MED ORDER — NEOSTIGMINE METHYLSULFATE 10 MG/10ML IV SOLN
INTRAVENOUS | Status: AC
  Filled 2015-04-15: qty 1

## 2015-04-15 MED ORDER — ACETAMINOPHEN 650 MG RE SUPP: 650.0000 mg | Freq: Four times a day (QID) | RECTAL | Status: DC | PRN

## 2015-04-15 MED ORDER — HYDROMORPHONE HCL 1 MG/ML IJ SOLN: 1.0000 mg | INTRAMUSCULAR | Status: DC | PRN

## 2015-04-15 MED ORDER — METHOCARBAMOL 1000 MG/10ML IJ SOLN: 500.0000 mg | Freq: Four times a day (QID) | INTRAVENOUS | Status: DC | PRN

## 2015-04-15 MED ORDER — DIPHENHYDRAMINE HCL 12.5 MG/5ML PO ELIX
12.5000 mg | ORAL_SOLUTION | ORAL | Status: DC | PRN
  Administered 2015-04-15: 25 mg via ORAL
  Filled 2015-04-15: qty 10

## 2015-04-15 MED ORDER — HYDROMORPHONE HCL 1 MG/ML IJ SOLN
1.0000 mg | INTRAMUSCULAR | Status: DC | PRN
  Administered 2015-04-15: 1 mg via INTRAVENOUS
  Filled 2015-04-15: qty 1

## 2015-04-15 MED ORDER — ONDANSETRON HCL 4 MG PO TABS: 4.0000 mg | ORAL_TABLET | Freq: Four times a day (QID) | ORAL | Status: DC | PRN

## 2015-04-15 MED ORDER — PROPOFOL 10 MG/ML IV BOLUS
INTRAVENOUS | Status: AC
  Filled 2015-04-15: qty 20

## 2015-04-15 MED ORDER — OXYCODONE HCL 5 MG PO TABS
5.0000 mg | ORAL_TABLET | ORAL | Status: DC | PRN
  Administered 2015-04-15: 5 mg via ORAL

## 2015-04-15 MED ORDER — SODIUM CHLORIDE 0.9 % IV SOLN: INTRAVENOUS | Status: DC

## 2015-04-15 MED ORDER — METHOCARBAMOL 500 MG PO TABS
500.0000 mg | ORAL_TABLET | Freq: Four times a day (QID) | ORAL | Status: DC | PRN
  Administered 2015-04-15 – 2015-04-17 (×5): 500 mg via ORAL
  Filled 2015-04-15 (×6): qty 1

## 2015-04-15 MED ORDER — GLYCOPYRROLATE 0.2 MG/ML IJ SOLN
INTRAMUSCULAR | Status: AC
  Filled 2015-04-15: qty 3

## 2015-04-15 MED ORDER — GLYCOPYRROLATE 0.2 MG/ML IJ SOLN
INTRAMUSCULAR | Status: DC | PRN
  Administered 2015-04-15: 0.6 mg via INTRAVENOUS

## 2015-04-15 MED ORDER — HYDROMORPHONE HCL 1 MG/ML IJ SOLN
0.2500 mg | INTRAMUSCULAR | Status: DC | PRN
  Administered 2015-04-15 (×4): 0.5 mg via INTRAVENOUS

## 2015-04-15 MED ORDER — HYDROMORPHONE HCL 1 MG/ML IJ SOLN
INTRAMUSCULAR | Status: AC
  Administered 2015-04-15: 0.5 mg via INTRAVENOUS
  Filled 2015-04-15: qty 1

## 2015-04-15 MED ORDER — ONDANSETRON HCL 4 MG/2ML IJ SOLN: 4.0000 mg | Freq: Four times a day (QID) | INTRAMUSCULAR | Status: DC | PRN

## 2015-04-15 MED ORDER — PROMETHAZINE HCL 25 MG/ML IJ SOLN: 6.2500 mg | INTRAMUSCULAR | Status: DC | PRN

## 2015-04-15 MED ORDER — ONDANSETRON HCL 4 MG/2ML IJ SOLN
INTRAMUSCULAR | Status: AC
  Filled 2015-04-15: qty 2

## 2015-04-15 MED ORDER — LIDOCAINE HCL (CARDIAC) 20 MG/ML IV SOLN
INTRAVENOUS | Status: AC
  Filled 2015-04-15: qty 5

## 2015-04-15 MED ORDER — FENTANYL CITRATE (PF) 250 MCG/5ML IJ SOLN
INTRAMUSCULAR | Status: AC
  Filled 2015-04-15: qty 5

## 2015-04-15 MED ORDER — ARTIFICIAL TEARS OP OINT
TOPICAL_OINTMENT | OPHTHALMIC | Status: AC
  Filled 2015-04-15: qty 3.5

## 2015-04-15 MED ORDER — CEFAZOLIN SODIUM 1-5 GM-% IV SOLN
1.0000 g | Freq: Four times a day (QID) | INTRAVENOUS | Status: AC
  Administered 2015-04-15 – 2015-04-16 (×3): 1 g via INTRAVENOUS
  Filled 2015-04-15 (×3): qty 50

## 2015-04-15 MED ORDER — METHOCARBAMOL 1000 MG/10ML IJ SOLN
500.0000 mg | Freq: Four times a day (QID) | INTRAVENOUS | Status: DC | PRN
  Filled 2015-04-15: qty 5

## 2015-04-15 MED ORDER — OXYCODONE HCL 5 MG PO TABS
ORAL_TABLET | ORAL | Status: AC
  Filled 2015-04-15: qty 1

## 2015-04-15 MED ORDER — FENTANYL CITRATE (PF) 100 MCG/2ML IJ SOLN
INTRAMUSCULAR | Status: DC | PRN
  Administered 2015-04-15: 75 ug via INTRAVENOUS

## 2015-04-15 MED ORDER — ROCURONIUM BROMIDE 100 MG/10ML IV SOLN
INTRAVENOUS | Status: DC | PRN
  Administered 2015-04-15: 40 mg via INTRAVENOUS

## 2015-04-15 MED ORDER — ROCURONIUM BROMIDE 50 MG/5ML IV SOLN
INTRAVENOUS | Status: AC
  Filled 2015-04-15: qty 1

## 2015-04-15 MED ORDER — LACTATED RINGERS IV SOLN
INTRAVENOUS | Status: DC | PRN
  Administered 2015-04-15: 07:00:00 via INTRAVENOUS

## 2015-04-15 MED ORDER — METOCLOPRAMIDE HCL 5 MG PO TABS: 5.0000 mg | ORAL_TABLET | Freq: Three times a day (TID) | ORAL | Status: DC | PRN

## 2015-04-15 MED ORDER — METHOCARBAMOL 500 MG PO TABS
ORAL_TABLET | ORAL | Status: AC
  Filled 2015-04-15: qty 1

## 2015-04-15 MED ORDER — MIDAZOLAM HCL 2 MG/2ML IJ SOLN
INTRAMUSCULAR | Status: AC
  Filled 2015-04-15: qty 4

## 2015-04-15 MED ORDER — ACETAMINOPHEN 325 MG PO TABS
650.0000 mg | ORAL_TABLET | Freq: Four times a day (QID) | ORAL | Status: DC | PRN
  Administered 2015-04-15 – 2015-04-17 (×5): 650 mg via ORAL
  Filled 2015-04-15 (×8): qty 2

## 2015-04-15 MED ORDER — LIDOCAINE HCL (CARDIAC) 20 MG/ML IV SOLN
INTRAVENOUS | Status: DC | PRN
  Administered 2015-04-15: 80 mg via INTRAVENOUS

## 2015-04-15 MED ORDER — METOCLOPRAMIDE HCL 5 MG/ML IJ SOLN: 5.0000 mg | Freq: Three times a day (TID) | INTRAMUSCULAR | Status: DC | PRN

## 2015-04-15 MED ORDER — METHOCARBAMOL 500 MG PO TABS: 500.0000 mg | ORAL_TABLET | Freq: Four times a day (QID) | ORAL | Status: DC | PRN

## 2015-04-15 MED ORDER — PROPOFOL 10 MG/ML IV BOLUS
INTRAVENOUS | Status: DC | PRN
  Administered 2015-04-15: 150 mg via INTRAVENOUS

## 2015-04-15 MED ORDER — ARTIFICIAL TEARS OP OINT
TOPICAL_OINTMENT | OPHTHALMIC | Status: DC | PRN
  Administered 2015-04-15: 1 via OPHTHALMIC

## 2015-04-15 SURGICAL SUPPLY — 57 items
BANDAGE ELASTIC 4 VELCRO ST LF (GAUZE/BANDAGES/DRESSINGS) ×3 IMPLANT
BIT DRILL 3.5 QC 155 (BIT) ×2 IMPLANT
BIT DRILL 3.5 QC 155MM (BIT) ×1
BNDG GAUZE ELAST 4 BULKY (GAUZE/BANDAGES/DRESSINGS) ×3 IMPLANT
COVER SURGICAL LIGHT HANDLE (MISCELLANEOUS) ×3 IMPLANT
DRAPE C-ARM 42X72 X-RAY (DRAPES) IMPLANT
DRAPE C-ARM MINI 42X72 WSTRAPS (DRAPES) ×3 IMPLANT
DRAPE IMP U-DRAPE 54X76 (DRAPES) ×3 IMPLANT
DRAPE INCISE IOBAN 66X45 STRL (DRAPES) IMPLANT
DRAPE OEC MINIVIEW 54X84 (DRAPES) ×3 IMPLANT
DRAPE U-SHAPE 47X51 STRL (DRAPES) ×3 IMPLANT
DRSG PAD ABDOMINAL 8X10 ST (GAUZE/BANDAGES/DRESSINGS) ×3 IMPLANT
ELECT REM PT RETURN 9FT ADLT (ELECTROSURGICAL) ×3
ELECTRODE REM PT RTRN 9FT ADLT (ELECTROSURGICAL) ×1 IMPLANT
GAUZE SPONGE 4X4 12PLY STRL (GAUZE/BANDAGES/DRESSINGS) ×3 IMPLANT
GAUZE XEROFORM 1X8 LF (GAUZE/BANDAGES/DRESSINGS) IMPLANT
GAUZE XEROFORM 5X9 LF (GAUZE/BANDAGES/DRESSINGS) ×3 IMPLANT
GLOVE BIO SURGEON STRL SZ8 (GLOVE) ×3 IMPLANT
GLOVE BIOGEL PI IND STRL 8 (GLOVE) ×2 IMPLANT
GLOVE BIOGEL PI INDICATOR 8 (GLOVE) ×4
GLOVE ORTHO TXT STRL SZ7.5 (GLOVE) ×3 IMPLANT
GOWN STRL REUS W/ TWL LRG LVL3 (GOWN DISPOSABLE) ×1 IMPLANT
GOWN STRL REUS W/ TWL XL LVL3 (GOWN DISPOSABLE) ×4 IMPLANT
GOWN STRL REUS W/TWL LRG LVL3 (GOWN DISPOSABLE) ×2
GOWN STRL REUS W/TWL XL LVL3 (GOWN DISPOSABLE) ×8
KIT BASIN OR (CUSTOM PROCEDURE TRAY) ×3 IMPLANT
KIT ROOM TURNOVER OR (KITS) ×3 IMPLANT
MANIFOLD NEPTUNE II (INSTRUMENTS) IMPLANT
NEEDLE 22X1 1/2 (OR ONLY) (NEEDLE) IMPLANT
NS IRRIG 1000ML POUR BTL (IV SOLUTION) ×3 IMPLANT
PACK SHOULDER (CUSTOM PROCEDURE TRAY) ×3 IMPLANT
PACK UNIVERSAL I (CUSTOM PROCEDURE TRAY) IMPLANT
PAD ARMBOARD 7.5X6 YLW CONV (MISCELLANEOUS) ×3 IMPLANT
PAD CAST 4YDX4 CTTN HI CHSV (CAST SUPPLIES) ×1 IMPLANT
PADDING CAST COTTON 4X4 STRL (CAST SUPPLIES) ×2
PLATE HUMEROUS LOCK 8 HOLE (Plate) ×3 IMPLANT
SCREW 4.5X22 (Screw) ×6 IMPLANT
SCREW LOCKING 4.5X16 (Screw) ×3 IMPLANT
SCREW LOCKING 4.5X22 (Screw) ×6 IMPLANT
SCREW NONLOCKING 4.5X16 (Screw) ×2 IMPLANT
SCREW NONLOCKING 4.5X24 (Screw) ×2 IMPLANT
SPONGE GAUZE 4X4 12PLY STER LF (GAUZE/BANDAGES/DRESSINGS) ×3 IMPLANT
SPONGE LAP 18X18 X RAY DECT (DISPOSABLE) ×6 IMPLANT
STAPLER VISISTAT 35W (STAPLE) IMPLANT
SUCTION FRAZIER TIP 10 FR DISP (SUCTIONS) ×3 IMPLANT
SUT ETHILON 2 0 FS 18 (SUTURE) ×6 IMPLANT
SUT FIBERWIRE #2 38 REV NDL BL (SUTURE) ×3
SUT VIC AB 0 CT1 27 (SUTURE) ×4
SUT VIC AB 0 CT1 27XBRD ANBCTR (SUTURE) ×2 IMPLANT
SUT VIC AB 2-0 CT1 27 (SUTURE) ×4
SUT VIC AB 2-0 CT1 TAPERPNT 27 (SUTURE) ×2 IMPLANT
SUTURE FIBERWR#2 38 REV NDL BL (SUTURE) ×1 IMPLANT
SYR CONTROL 10ML LL (SYRINGE) IMPLANT
TOWEL OR 17X24 6PK STRL BLUE (TOWEL DISPOSABLE) ×3 IMPLANT
TOWEL OR 17X26 10 PK STRL BLUE (TOWEL DISPOSABLE) ×3 IMPLANT
WATER STERILE IRR 1000ML POUR (IV SOLUTION) ×3 IMPLANT
YANKAUER SUCT BULB TIP NO VENT (SUCTIONS) ×3 IMPLANT

## 2015-04-15 NOTE — Brief Op Note (Signed)
04/11/2015 - 04/15/2015  9:23 AM  PATIENT:  Kenneth GrumblingAaron Frank  19 y.o. male  PRE-OPERATIVE DIAGNOSIS:  LEFT HUMERUS FRACTURE  POST-OPERATIVE DIAGNOSIS:  LEFT HUMERUS FRACTURE  PROCEDURE:  Procedure(s): OPEN REDUCTION INTERNAL FIXATION (ORIF) HUMERAL SHAFT FRACTURE (Left)  SURGEON:  Surgeon(s) and Role:    * Kathryne Hitchhristopher Y Nozomi Mettler, MD - Primary  PHYSICIAN ASSISTANT: Rexene EdisonGil Clark, PA-C  ANESTHESIA:   general  EBL:  Total I/O In: 500 [I.V.:500] Out: -    COUNTS:  YES  TOURNIQUET:  * No tourniquets in log *  DICTATION: .Other Dictation: Dictation Number [id number]  PLAN OF CARE: Admit to inpatient   PATIENT DISPOSITION:  PACU - hemodynamically stable.   Delay start of Pharmacological VTE agent (>24hrs) due to surgical blood loss or risk of bleeding: not applicable
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
## 2015-04-15 NOTE — Anesthesia Procedure Notes (Signed)
Procedure Name: Intubation Date/Time: 04/15/2015 7:40 AM Performed by: Roney MansSMITH, Darcelle Herrada P Pre-anesthesia Checklist: Patient identified, Emergency Drugs available, Timeout performed, Suction available and Patient being monitored Patient Re-evaluated:Patient Re-evaluated prior to inductionOxygen Delivery Method: Circle system utilized Preoxygenation: Pre-oxygenation with 100% oxygen Intubation Type: IV induction Ventilation: Mask ventilation without difficulty Laryngoscope Size: Mac and 4 Grade View: Grade I Tube type: Oral Tube size: 7.5 mm Number of attempts: 1 Airway Equipment and Method: Stylet Placement Confirmation: ETT inserted through vocal cords under direct vision,  breath sounds checked- equal and bilateral and positive ETCO2 Secured at: 24 cm Tube secured with: Tape Dental Injury: Teeth and Oropharynx as per pre-operative assessment

## 2015-04-15 NOTE — Anesthesia Postprocedure Evaluation (Signed)
  Anesthesia Post-op Note  Patient: Kenneth Frank Kenneth Frank  Procedure(s) Performed: Procedure(s): OPEN REDUCTION INTERNAL FIXATION (ORIF) HUMERAL SHAFT FRACTURE (Left)  Patient Location: PACU  Anesthesia Type:General  Level of Consciousness: awake  Airway and Oxygen Therapy: Patient Spontanous Breathing  Post-op Pain: mild  Post-op Assessment: Post-op Vital signs reviewed LLE Motor Response: Purposeful movement LLE Sensation: Full sensation RLE Motor Response: Purposeful movement RLE Sensation: Full sensation      Post-op Vital Signs: stable  Last Vitals:  Filed Vitals:   04/15/15 1045  BP: 121/76  Pulse: 66  Temp: 37.1 C  Resp: 10    Complications: No apparent anesthesia complications

## 2015-04-15 NOTE — Transfer of Care (Signed)
Immediate Anesthesia Transfer of Care Note  Patient: Kenneth Frank  Procedure(s) Performed: Procedure(s): OPEN REDUCTION INTERNAL FIXATION (ORIF) HUMERAL SHAFT FRACTURE (Left)  Patient Location: PACU  Anesthesia Type:General  Level of Consciousness: awake, oriented, patient cooperative and lethargic  Airway & Oxygen Therapy: Patient Spontanous Breathing and Patient connected to nasal cannula oxygen  Post-op Assessment: Report given to RN, Post -op Vital signs reviewed and stable and Patient moving all extremities  Post vital signs: Reviewed and stable  Last Vitals:  BP 130/85 HR 70 RR 10 SpO2 100 Resting comfortably, maintains good airway, denies pain.   Complications: No apparent anesthesia complications

## 2015-04-15 NOTE — Progress Notes (Signed)
Patient ID: Kenneth Frank, male   DOB: April 23, 1996, 19 y.o.   MRN: [id number] Will proceed to the OR today for open reduction/internal fixation of his left humerus shaft fracture.  Risks and benefits have been explained and understood.

## 2015-04-15 NOTE — Anesthesia Preprocedure Evaluation (Addendum)
Anesthesia Evaluation  Patient identified by MRN, date of birth, ID band Patient awake    Reviewed: Allergy & Precautions, NPO status , Patient's Chart, lab work & pertinent test results  History of Anesthesia Complications Negative for: history of anesthetic complications  Airway Mallampati: II  TM Distance: >3 FB Neck ROM: Full    Dental  (+) Teeth Intact   Pulmonary asthma ,    breath sounds clear to auscultation       Cardiovascular negative cardio ROS   Rhythm:Regular Rate:Normal     Neuro/Psych Traumatic brain inury    GI/Hepatic negative GI ROS, Neg liver ROS,   Endo/Other  negative endocrine ROS  Renal/GU negative Renal ROS     Musculoskeletal Fx humerus   Abdominal   Peds  Hematology   Anesthesia Other Findings   Reproductive/Obstetrics                            Anesthesia Physical Anesthesia Plan  ASA: II  Anesthesia Plan: General   Post-op Pain Management:    Induction: Intravenous  Airway Management Planned: Oral ETT  Additional Equipment:   Intra-op Plan:   Post-operative Plan: Extubation in OR  Informed Consent: I have reviewed the patients History and Physical, chart, labs and discussed the procedure including the risks, benefits and alternatives for the proposed anesthesia with the patient or authorized representative who has indicated his/her understanding and acceptance.   Dental advisory given  Plan Discussed with: CRNA and Surgeon  Anesthesia Plan Comments:         Anesthesia Quick Evaluation

## 2015-04-15 NOTE — Progress Notes (Signed)
Additional report given to 5N RN this morning. Belongings transferred to new room 5N06

## 2015-04-16 ENCOUNTER — Inpatient Hospital Stay (HOSPITAL_COMMUNITY): Payer: Managed Care, Other (non HMO) | Admitting: Occupational Therapy

## 2015-04-16 ENCOUNTER — Inpatient Hospital Stay (HOSPITAL_COMMUNITY): Payer: Managed Care, Other (non HMO) | Admitting: Physical Therapy

## 2015-04-16 ENCOUNTER — Encounter (HOSPITAL_COMMUNITY): Payer: Managed Care, Other (non HMO) | Admitting: Occupational Therapy

## 2015-04-16 NOTE — Progress Notes (Signed)
Subjective:     Patient follows commands.  Objective: Vital signs in last 24 hours: Temp:  [98.4 F (36.9 C)-100.3 F (37.9 C)] 98.4 F (36.9 C) (11/13 0545) Pulse Rate:  [64-77] 76 (11/13 0545) Resp:  [10-16] 16 (11/13 0545) BP: (113-138)/(65-96) 113/71 mmHg (11/13 0545) SpO2:  [100 %] 100 % (11/13 0545)  Intake/Output from previous day:   Intake/Output this shift:    No results for input(s): HGB in the last 72 hours. No results for input(s): WBC, RBC, HCT, PLT in the last 72 hours. No results for input(s): NA, K, CL, CO2, BUN, CREATININE, GLUCOSE, CALCIUM in the last 72 hours. No results for input(s): LABPT, INR in the last 72 hours.   Left upper extremity: Full motor of left hand Neurovascular intact Incision: dressing C/D/I Compartment soft  Assessment/Plan: Post-op day one status post ORIF left humerus  Fracture GROM of left elbow wrist and hand encouraged. Richardean CanalCLARK, GILBERT 04/16/2015, 9:48 AM

## 2015-04-17 ENCOUNTER — Inpatient Hospital Stay (HOSPITAL_COMMUNITY): Payer: Managed Care, Other (non HMO) | Admitting: Physical Therapy

## 2015-04-17 ENCOUNTER — Encounter (HOSPITAL_COMMUNITY): Payer: Self-pay | Admitting: Orthopaedic Surgery

## 2015-04-17 ENCOUNTER — Inpatient Hospital Stay (HOSPITAL_COMMUNITY): Payer: Managed Care, Other (non HMO)

## 2015-04-17 ENCOUNTER — Inpatient Hospital Stay (HOSPITAL_COMMUNITY): Payer: Managed Care, Other (non HMO) | Admitting: Speech Pathology

## 2015-04-17 NOTE — Care Management (Signed)
Utilization review completed. Daryan Cagley, RN Case Manager [phone]. 
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
## 2015-04-17 NOTE — Progress Notes (Signed)
Rehab admissions - Patient well known to me from recent stay on inpatient rehab.  If we want to re admit to inpatient rehab, will need PT and OT re evaluations because patient has [employer] managed and we will need re authorization.  Call me for questions.  #[phone]

## 2015-04-17 NOTE — Progress Notes (Signed)
Patient ID: Kenneth Frank XXXJett, male   DOB: [birth year], 19 y.o.   MRN: [id number] No acute changes.  Tolerated left humerus surgery well.  Vitals stable and medically stable.  Will re-consult the Rehab MD's today for possible return today to Inpatient Rehab.

## 2015-04-17 NOTE — Progress Notes (Signed)
Rehab admissions - I met with patient, mom and with rehab team.  Patient is not doing too well to meet criteria for acute inpatient rehab admission.  Patient can be discharged directly home for acute hospital.  No need to return in inpatient rehab.  I have let the unit case manager know of need to discharge home once medically ready.  Call me for questions.  #[phone]
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
## 2015-04-18 MED ORDER — OXYCODONE-ACETAMINOPHEN 5-325 MG PO TABS: 1.0000 | ORAL_TABLET | ORAL | Status: DC | PRN

## 2015-04-18 MED ORDER — METHOCARBAMOL 500 MG PO TABS: 500.0000 mg | ORAL_TABLET | Freq: Four times a day (QID) | ORAL | Status: DC

## 2015-04-18 NOTE — Progress Notes (Signed)
Orthopedic Tech Progress Note Patient Details:  Kenneth Frank [birth year] [id number]  Ortho Devices Type of Ortho Device: Arm sling Ortho Device/Splint Interventions: Application   [name] 04/18/2015, 2:19 PM
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
## 2015-04-18 NOTE — Progress Notes (Signed)
Patient ID: Kenneth Frank XXXJett, male   DOB: 09/28/1995, 19 y.o.   MRN: [id number] The Rehab team has graciously said they will take a look at Kenneth Frank this am and a least help coordinate what he may need at home as an outpatient in terms of resources for his traumatic brain injury recovery.
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
## 2015-04-18 NOTE — Progress Notes (Signed)
Called dr Magnus IvanBlackman about completing AVS for discharge

## 2015-04-18 NOTE — Discharge Instructions (Signed)
Ice intermitantly for left arm swelling. Use your left arm as comfort allows, but no heavy lifting. Can get current left arm dressing wet in the shower daily. You can remove the dressing in 5 days and start getting the actual incision wet daily followed by a new dry dressing. Encourage getting out of bed and sitting up as well as ambulating as much as possible.

## 2015-04-18 NOTE — Progress Notes (Addendum)
I have spoken to entire rehab team involved with patient/family conversations yesterday. In reviewing his chart, he was making nice progress by the end of last week (while on inpatient rehab).  His mobility was only augmented by his recent surgery which improved his activity tolerance and pain control. He is at a contact guard assistance level at this point and doesn't need to return to inpatient rehab. Cognitively, he has improved substantially as well.   It appears that he will go home with family in [place] until Thanksgiving and then return to [place]. I will set him up with an appointment to see me in my brain injury clinic over the next few weeks. He will also need referrals for outpt SLP, PT, OT at [facility] which can commence when he returns to the area---I have made referrals for all of the above.   Please contact me with any questions,  Ranelle OysterZachary T. Adaijah Endres, MD, [facility] [place] [facility] 04/18/2015 [phone]

## 2015-04-19 ENCOUNTER — Encounter (HOSPITAL_COMMUNITY): Payer: Self-pay | Admitting: Orthopaedic Surgery

## 2015-05-04 ENCOUNTER — Ambulatory Visit: Payer: Managed Care, Other (non HMO) | Attending: Physical Medicine & Rehabilitation | Admitting: Occupational Therapy

## 2015-05-04 DIAGNOSIS — M79604 Pain in right leg: Secondary | ICD-10-CM | POA: Diagnosis present

## 2015-05-04 DIAGNOSIS — M25522 Pain in left elbow: Secondary | ICD-10-CM

## 2015-05-04 DIAGNOSIS — R29898 Other symptoms and signs involving the musculoskeletal system: Secondary | ICD-10-CM | POA: Diagnosis present

## 2015-05-04 DIAGNOSIS — M79622 Pain in left upper arm: Secondary | ICD-10-CM | POA: Insufficient documentation

## 2015-05-04 DIAGNOSIS — R41841 Cognitive communication deficit: Secondary | ICD-10-CM | POA: Insufficient documentation

## 2015-05-04 DIAGNOSIS — R4189 Other symptoms and signs involving cognitive functions and awareness: Secondary | ICD-10-CM | POA: Diagnosis present

## 2015-05-04 DIAGNOSIS — M25622 Stiffness of left elbow, not elsewhere classified: Secondary | ICD-10-CM

## 2015-05-05 ENCOUNTER — Ambulatory Visit: Payer: Managed Care, Other (non HMO)

## 2015-05-05 DIAGNOSIS — M79622 Pain in left upper arm: Secondary | ICD-10-CM | POA: Diagnosis not present

## 2015-05-05 DIAGNOSIS — R41841 Cognitive communication deficit: Secondary | ICD-10-CM

## 2015-05-05 DIAGNOSIS — M79604 Pain in right leg: Secondary | ICD-10-CM
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
## 2015-05-05 NOTE — Patient Instructions (Signed)
Piriformis (Supine)    Cross legs, right on top. Gently pull other knee toward chest until stretch is felt in buttock/hip of top leg. Hold _30___ seconds. Repeat __3__ times per set. Do __1__ sets per session. Do __2__ sessions per day. **Progress to this stretch after the one below becomes easy.  http://orth.exer.us/676   Copyright  VHI. All rights reserved.   Piriformis Stretch    Lying on back, pull right knee toward opposite shoulder. Hold __30__ seconds. Repeat __3__ times. Do _2___ sessions per day.  http://gt2.exer.us/257   Copyright  VHI. All rights reserved.   Hamstring Stretch, Seated (Strap, Two Chairs)    Sit with one leg extended onto facing chair. Don't use strap. Keep back straight and bend hips so you're closer to right leg. No reaching or slouching. Hold for _30 seconds. Repeat __3__ times each leg.  Copyright  VHI. All rights reserved.
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
## 2015-05-09 ENCOUNTER — Ambulatory Visit: Payer: Managed Care, Other (non HMO) | Admitting: Occupational Therapy
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
## 2015-05-11 ENCOUNTER — Ambulatory Visit: Payer: Managed Care, Other (non HMO) | Admitting: Occupational Therapy

## 2015-05-11 ENCOUNTER — Encounter: Payer: Self-pay | Admitting: Occupational Therapy

## 2015-05-11 ENCOUNTER — Telehealth: Payer: Self-pay | Admitting: Occupational Therapy

## 2015-05-11 NOTE — Therapy (Deleted)
[place] [address] [place], [place], [address] Phone: [phone]   Fax:  [phone]  Patient Details  Name: Kenneth Frank MRN: [id number] Date of Birth: 03/25/1996 Referring Provider:  Meredith Staggers, MD  Encounter Date: 05/11/2015   [name],[name] 05/11/2015, 7:51 PM OCCUPATIONAL THERAPY DISCHARGE SUMMARY  Visits from Start of Care: 1  Current functional level related to goals / functional outcomes: Goals not met, pt did not return.   Remaining deficits: See eval   Education / Equipment: Education was not completed as pt was seen to eval. He no showed for his f/u appointments. When therapist contacted him he reported returning to [facility].  Pt plans to pursue therapy in [place]. He was instructed to contact MD regarding a referral for therapy in [place].  Plan: Patient agrees to discharge.  Patient goals were not met. Patient is being discharged due to a change in medical status.  ?????     [place] [address] [place], [place], [address] Phone: [phone]   Fax:  [phone]

## 2015-05-11 NOTE — Therapy (Signed)
[place] [address] [place], [place], [address] Phone: [phone]   Fax:  [phone]  Patient Details  Name: Kenneth Frank MRN: [id number] Date of Birth: 06-08-1995 Referring Provider:  No ref. provider found  Encounter Date: 05/11/2015  OCCUPATIONAL THERAPY DISCHARGE SUMMARY  Visits from Start of Care: 1  Current functional level related to goals / functional outcomes: Pt did not meet goals as he did not return    Remaining deficits: See eval   Education / Equipment: Pt was seen for eval, then he no-showed for follow up appointments. Therapist contacted him an he has returned to [facility] and plans to pursue tx there. Pt was instructed to contact MD for a referral.  Plan: Patient agrees to discharge.  Patient goals were not met. Patient is being discharged due to not returning since the last visit.  ?????     [name],[name] 05/11/2015, 7:57 PM  [place] [address] [place], [place], [address] Phone: [phone]   Fax:  [phone]

## 2015-05-11 NOTE — Telephone Encounter (Signed)
Therapist called pt. As he no-showed for Tuesdays and todays appointment. He reports [facility]. Therapist said she would cancel all remaining appointments at this time. Pt reports he wants to receive therapy in [place]. Therapist recommended pt contact Dr. Rosalyn ChartersSwartz's office to have them fax a referral [facility]. Pt verbalized understanding.

## 2015-05-16 ENCOUNTER — Encounter: Payer: Managed Care, Other (non HMO) | Admitting: Occupational Therapy

## 2015-05-17 ENCOUNTER — Encounter: Payer: Managed Care, Other (non HMO) | Admitting: Occupational Therapy

## 2015-05-23 ENCOUNTER — Encounter: Payer: Managed Care, Other (non HMO) | Admitting: Occupational Therapy

## 2015-05-24 ENCOUNTER — Encounter: Payer: Managed Care, Other (non HMO) | Admitting: Occupational Therapy

## 2015-06-01 ENCOUNTER — Encounter: Payer: Managed Care, Other (non HMO) | Admitting: Occupational Therapy

## 2015-09-28 ENCOUNTER — Emergency Department (HOSPITAL_COMMUNITY)
Admission: EM | Admit: 2015-09-28 | Discharge: 2015-09-28 | Disposition: A | Discharge status: Home / Self Care | Payer: Managed Care, Other (non HMO) | Attending: Emergency Medicine | Admitting: Emergency Medicine

## 2015-09-28 ENCOUNTER — Encounter (HOSPITAL_COMMUNITY): Payer: Self-pay | Admitting: Emergency Medicine

## 2015-09-28 DIAGNOSIS — J45901 Unspecified asthma with (acute) exacerbation: Secondary | ICD-10-CM | POA: Diagnosis not present

## 2015-09-28 DIAGNOSIS — R062 Wheezing: Secondary | ICD-10-CM | POA: Diagnosis present

## 2015-09-28 MED ORDER — ALBUTEROL SULFATE (2.5 MG/3ML) 0.083% IN NEBU
INHALATION_SOLUTION | RESPIRATORY_TRACT | Status: AC
  Filled 2015-09-28: qty 6

## 2015-09-28 MED ORDER — ALBUTEROL SULFATE (2.5 MG/3ML) 0.083% IN NEBU
5.0000 mg | INHALATION_SOLUTION | Freq: Once | RESPIRATORY_TRACT | Status: AC
  Administered 2015-09-28: 5 mg via RESPIRATORY_TRACT

## 2015-09-28 NOTE — ED Notes (Signed)
Pt. reports wheezing with productive cough onset today , pt. ran out of his MDI , denies fever or chills.

## 2016-07-15 ENCOUNTER — Telehealth (INDEPENDENT_AMBULATORY_CARE_PROVIDER_SITE_OTHER): Payer: Self-pay | Admitting: *Deleted

## 2016-07-15 NOTE — Telephone Encounter (Signed)
Patient came in this morning in regards to needing a referral for physical therapy? He does not remember the name of where he has gone in the past. His CB # [phone]. Thank you

## 2016-07-15 NOTE — Telephone Encounter (Signed)
Do you recall?

## 2016-07-25 ENCOUNTER — Telehealth (INDEPENDENT_AMBULATORY_CARE_PROVIDER_SITE_OTHER): Payer: Self-pay | Admitting: Orthopaedic Surgery

## 2016-07-25 NOTE — Telephone Encounter (Signed)
Patient called asked for a call back concerning him needing to be referred for (PT) again.  The number to contact him is [phone]

## 2016-07-26 NOTE — Telephone Encounter (Signed)
Can you please write new Rx for PT for this patient, (or I can, just let me know what he needs)

## 2016-07-26 NOTE — Telephone Encounter (Signed)
LMOM for patient askinghim the below message from Dr. Magnus IvanBlackman and to call back
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
## 2016-08-27 ENCOUNTER — Telehealth (INDEPENDENT_AMBULATORY_CARE_PROVIDER_SITE_OTHER): Payer: Self-pay | Admitting: Orthopaedic Surgery

## 2016-08-27 NOTE — Telephone Encounter (Signed)
appt made for patient

## 2016-08-27 NOTE — Telephone Encounter (Signed)
PT REQUESTED A CALL BACK REGARDING PREVIOUS MESSAGE, STATED HE MISSED YOUR CALL A WEEK AGO

## 2016-09-05 ENCOUNTER — Other Ambulatory Visit (INDEPENDENT_AMBULATORY_CARE_PROVIDER_SITE_OTHER): Payer: Self-pay

## 2016-09-05 DIAGNOSIS — M898X2 Other specified disorders of bone, upper arm: Secondary | ICD-10-CM

## 2016-09-05 DIAGNOSIS — M79622 Pain in left upper arm: Secondary | ICD-10-CM

## 2016-09-09 ENCOUNTER — Ambulatory Visit (HOSPITAL_BASED_OUTPATIENT_CLINIC_OR_DEPARTMENT_OTHER)
Admission: RE | Admit: 2016-09-09 | Discharge: 2016-09-09 | Disposition: A | Discharge status: Home / Self Care | Payer: Managed Care, Other (non HMO) | Source: Ambulatory Visit | Attending: Orthopaedic Surgery | Admitting: Orthopaedic Surgery

## 2016-09-09 ENCOUNTER — Ambulatory Visit (INDEPENDENT_AMBULATORY_CARE_PROVIDER_SITE_OTHER): Payer: Managed Care, Other (non HMO) | Admitting: Orthopaedic Surgery

## 2016-09-09 DIAGNOSIS — M898X2 Other specified disorders of bone, upper arm: Secondary | ICD-10-CM

## 2016-09-09 DIAGNOSIS — M79622 Pain in left upper arm: Secondary | ICD-10-CM | POA: Diagnosis present

## 2016-09-09 DIAGNOSIS — S42352D Displaced comminuted fracture of shaft of humerus, left arm, subsequent encounter for fracture with routine healing: Secondary | ICD-10-CM

## 2016-09-09 NOTE — Progress Notes (Signed)
The patient is 17 months out from open reduction/internal fixation of a left humeral shaft fracture. He has been doing well but want her to be seen today for new x-rays of his arm and to initiate physical therapy on his left upper extremity that he feels it is weaker to him.  On examination of his left upper extremity has full range of motion of the shoulder and elbow. He has a well-healed surgical incision. He's neurovascularly intact distally. He does have weak biceps function of the arm but otherwise no glaring deformities.  2 views of his left humerus and apparently reviewed by me show the fracture has healed completely. The hardware is intact with no, getting features. I was able to go over these x-rays as well as his injury films and his immediate postoperative films and you can see that he is healed the fracture completely. This gave him reassurance as well.  At this point we can set him up for outpatient physical therapy to work on strengthening of his left upper extremity. He'll follow up as needed.

## 2016-09-26 ENCOUNTER — Emergency Department (HOSPITAL_COMMUNITY)
Admission: EM | Admit: 2016-09-26 | Discharge: 2016-09-26 | Disposition: A | Discharge status: Home / Self Care | Payer: Managed Care, Other (non HMO) | Attending: Emergency Medicine | Admitting: Emergency Medicine

## 2016-09-26 ENCOUNTER — Emergency Department (HOSPITAL_COMMUNITY): Payer: Managed Care, Other (non HMO)

## 2016-09-26 ENCOUNTER — Encounter (HOSPITAL_COMMUNITY): Payer: Self-pay | Admitting: Emergency Medicine

## 2016-09-26 DIAGNOSIS — Z79899 Other long term (current) drug therapy: Secondary | ICD-10-CM | POA: Diagnosis not present

## 2016-09-26 DIAGNOSIS — A088 Other specified intestinal infections: Secondary | ICD-10-CM | POA: Diagnosis not present

## 2016-09-26 DIAGNOSIS — J45909 Unspecified asthma, uncomplicated: Secondary | ICD-10-CM | POA: Diagnosis not present

## 2016-09-26 DIAGNOSIS — R109 Unspecified abdominal pain: Secondary | ICD-10-CM | POA: Diagnosis present

## 2016-09-26 DIAGNOSIS — A09 Infectious gastroenteritis and colitis, unspecified: Secondary | ICD-10-CM

## 2016-09-26 LAB — URINALYSIS, ROUTINE W REFLEX MICROSCOPIC
Bacteria, UA: NONE SEEN
Bilirubin Urine: NEGATIVE
GLUCOSE, UA: NEGATIVE mg/dL
Hgb urine dipstick: NEGATIVE
KETONES UR: NEGATIVE mg/dL
Leukocytes, UA: NEGATIVE
Nitrite: NEGATIVE
Protein, ur: 30 mg/dL — AB
SPECIFIC GRAVITY, URINE: 1.024 (ref 1.005–1.030)
pH: 5 (ref 5.0–8.0)

## 2016-09-26 LAB — COMPREHENSIVE METABOLIC PANEL
ALBUMIN: 4 g/dL (ref 3.5–5.0)
ALK PHOS: 76 U/L (ref 38–126)
ALT: 16 U/L — AB (ref 17–63)
ANION GAP: 7 (ref 5–15)
AST: 23 U/L (ref 15–41)
BUN: 10 mg/dL (ref 6–20)
CALCIUM: 9.2 mg/dL (ref 8.9–10.3)
CO2: 26 mmol/L (ref 22–32)
Chloride: 107 mmol/L (ref 101–111)
Creatinine, Ser: 0.96 mg/dL (ref 0.61–1.24)
GFR calc Af Amer: >60 mL/min (ref >60)
GFR calc non Af Amer: >60 mL/min (ref >60)
GLUCOSE: 82 mg/dL (ref 65–99)
Potassium: 3 mmol/L — ABNORMAL LOW (ref 3.5–5.1)
SODIUM: 140 mmol/L (ref 135–145)
Total Bilirubin: 0.4 mg/dL (ref 0.3–1.2)
Total Protein: 8.2 g/dL — ABNORMAL HIGH (ref 6.5–8.1)

## 2016-09-26 LAB — CBC
HEMATOCRIT: 38.2 % — AB (ref 39.0–52.0)
HEMOGLOBIN: 12.7 g/dL — AB (ref 13.0–17.0)
MCH: 27.9 pg (ref 26.0–34.0)
MCHC: 33.2 g/dL (ref 30.0–36.0)
MCV: 84 fL (ref 78.0–100.0)
Platelets: 191 10*3/uL (ref 150–400)
RBC: 4.55 MIL/uL (ref 4.22–5.81)
RDW: 13.2 % (ref 11.5–15.5)
WBC: 4.9 10*3/uL (ref 4.0–10.5)

## 2016-09-26 LAB — LIPASE, BLOOD: Lipase: 24 U/L (ref 11–51)

## 2016-09-26 MED ORDER — DICYCLOMINE HCL 20 MG PO TABS: 20.0000 mg | ORAL_TABLET | Freq: Three times a day (TID) | ORAL | 0 refills | Status: DC | PRN

## 2016-09-26 MED ORDER — IOPAMIDOL (ISOVUE-300) INJECTION 61%
INTRAVENOUS | Status: AC
  Administered 2016-09-26: 100 mL
  Filled 2016-09-26: qty 100

## 2016-09-26 MED ORDER — DICYCLOMINE HCL 10 MG PO CAPS
10.0000 mg | ORAL_CAPSULE | Freq: Once | ORAL | Status: AC
  Administered 2016-09-26: 10 mg via ORAL
  Filled 2016-09-26: qty 1

## 2016-09-26 MED ORDER — KETOROLAC TROMETHAMINE 60 MG/2ML IM SOLN
60.0000 mg | Freq: Once | INTRAMUSCULAR | Status: AC
  Administered 2016-09-26: 60 mg via INTRAMUSCULAR
  Filled 2016-09-26: qty 2

## 2016-09-26 MED ORDER — SODIUM CHLORIDE 0.9 % IV BOLUS (SEPSIS)
1000.0000 mL | Freq: Once | INTRAVENOUS | Status: AC
Start: 2016-09-26 — End: 2016-09-26
  Administered 2016-09-26: 1000 mL via INTRAVENOUS

## 2016-09-26 NOTE — ED Triage Notes (Signed)
Pt presents to ED after 2 weeks of worsening diarrhea that patient describes as watery.  Patient states he last two nights he has been woken up after he has had a bowel movement on himself.  Patient now c/o lower abdominal cramping.  Denies nausea.

## 2016-09-26 NOTE — ED Notes (Signed)
Pt inquiring about how long it will be until he can be discharged.
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
## 2016-09-26 NOTE — ED Notes (Signed)
Pt ambulated to restroom.  Ready to go home.

## 2016-09-26 NOTE — ED Notes (Signed)
Patient transported to X-ray 

## 2016-10-14 ENCOUNTER — Ambulatory Visit: Payer: No Typology Code available for payment source | Admitting: Physical Therapy

## 2016-10-22 ENCOUNTER — Encounter: Payer: Self-pay | Admitting: Physical Therapy

## 2016-10-22 ENCOUNTER — Ambulatory Visit: Payer: 59 | Attending: Orthopaedic Surgery | Admitting: Physical Therapy

## 2016-10-22 ENCOUNTER — Ambulatory Visit: Payer: No Typology Code available for payment source

## 2016-10-22 DIAGNOSIS — M6281 Muscle weakness (generalized): Secondary | ICD-10-CM | POA: Diagnosis present
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
## 2016-10-24 ENCOUNTER — Ambulatory Visit: Payer: 59 | Admitting: Physical Therapy

## 2016-10-24 DIAGNOSIS — M6281 Muscle weakness (generalized): Secondary | ICD-10-CM | POA: Diagnosis not present
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
## 2016-10-29 ENCOUNTER — Ambulatory Visit: Payer: 59 | Admitting: Physical Therapy

## 2016-10-29 DIAGNOSIS — M6281 Muscle weakness (generalized): Secondary | ICD-10-CM
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
## 2016-10-31 ENCOUNTER — Ambulatory Visit: Payer: 59 | Admitting: Physical Therapy

## 2016-10-31 ENCOUNTER — Encounter: Payer: Self-pay | Admitting: Physical Therapy

## 2016-10-31 DIAGNOSIS — M6281 Muscle weakness (generalized): Secondary | ICD-10-CM
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
## 2016-11-01 ENCOUNTER — Ambulatory Visit: Payer: 59 | Admitting: Physical Therapy

## 2016-11-05 ENCOUNTER — Ambulatory Visit: Payer: 59 | Attending: Orthopaedic Surgery | Admitting: Physical Therapy

## 2016-11-05 DIAGNOSIS — M6281 Muscle weakness (generalized): Secondary | ICD-10-CM | POA: Diagnosis present
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
## 2016-11-07 ENCOUNTER — Ambulatory Visit: Payer: 59 | Admitting: Physical Therapy

## 2016-11-07 ENCOUNTER — Encounter: Payer: Self-pay | Admitting: Physical Therapy

## 2016-11-07 DIAGNOSIS — M6281 Muscle weakness (generalized): Secondary | ICD-10-CM
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
## 2016-11-12 ENCOUNTER — Encounter: Payer: Self-pay | Admitting: Physical Therapy

## 2016-11-12 ENCOUNTER — Ambulatory Visit: Payer: 59 | Admitting: Physical Therapy

## 2016-11-12 DIAGNOSIS — M6281 Muscle weakness (generalized): Secondary | ICD-10-CM
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
## 2016-11-14 ENCOUNTER — Ambulatory Visit: Payer: 59 | Admitting: Physical Therapy

## 2016-11-19 ENCOUNTER — Ambulatory Visit: Payer: 59 | Admitting: Physical Therapy

## 2016-11-21 ENCOUNTER — Ambulatory Visit: Payer: 59 | Admitting: Physical Therapy

## 2016-11-21 DIAGNOSIS — M6281 Muscle weakness (generalized): Secondary | ICD-10-CM
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
## 2017-01-05 ENCOUNTER — Encounter (HOSPITAL_COMMUNITY): Payer: Self-pay | Admitting: *Deleted

## 2017-01-05 ENCOUNTER — Emergency Department (HOSPITAL_COMMUNITY)
Admission: EM | Admit: 2017-01-05 | Discharge: 2017-01-05 | Disposition: A | Discharge status: Home / Self Care | Payer: 59 | Attending: Emergency Medicine | Admitting: Emergency Medicine

## 2017-01-05 DIAGNOSIS — K645 Perianal venous thrombosis: Secondary | ICD-10-CM | POA: Diagnosis not present

## 2017-01-05 DIAGNOSIS — Z79899 Other long term (current) drug therapy: Secondary | ICD-10-CM | POA: Insufficient documentation

## 2017-01-05 DIAGNOSIS — K644 Residual hemorrhoidal skin tags: Secondary | ICD-10-CM

## 2017-01-05 DIAGNOSIS — K625 Hemorrhage of anus and rectum: Secondary | ICD-10-CM | POA: Diagnosis present

## 2017-01-05 DIAGNOSIS — K922 Gastrointestinal hemorrhage, unspecified: Secondary | ICD-10-CM | POA: Diagnosis not present

## 2017-01-05 DIAGNOSIS — J45909 Unspecified asthma, uncomplicated: Secondary | ICD-10-CM | POA: Diagnosis not present

## 2017-01-05 LAB — I-STAT CHEM 8, ED
BUN: 14 mg/dL (ref 6–20)
CALCIUM ION: 1.21 mmol/L (ref 1.15–1.40)
CREATININE: 1.1 mg/dL (ref 0.61–1.24)
Chloride: 100 mmol/L — ABNORMAL LOW (ref 101–111)
GLUCOSE: 82 mg/dL (ref 65–99)
HCT: 36 % — ABNORMAL LOW (ref 39.0–52.0)
HEMOGLOBIN: 12.2 g/dL — AB (ref 13.0–17.0)
Potassium: 4.2 mmol/L (ref 3.5–5.1)
Sodium: 140 mmol/L (ref 135–145)
TCO2: 31 mmol/L (ref 0–100)

## 2017-01-05 LAB — CBC WITH DIFFERENTIAL/PLATELET
BASOS PCT: 1 %
Basophils Absolute: 0 10*3/uL (ref 0.0–0.1)
EOS ABS: 0 10*3/uL (ref 0.0–0.7)
Eosinophils Relative: 0 %
HCT: 36.2 % — ABNORMAL LOW (ref 39.0–52.0)
HEMOGLOBIN: 12.3 g/dL — AB (ref 13.0–17.0)
LYMPHS ABS: 1 10*3/uL (ref 0.7–4.0)
Lymphocytes Relative: 29 %
MCH: 28.9 pg (ref 26.0–34.0)
MCHC: 34 g/dL (ref 30.0–36.0)
MCV: 85 fL (ref 78.0–100.0)
MONO ABS: 0.5 10*3/uL (ref 0.1–1.0)
MONOS PCT: 16 %
NEUTROS PCT: 54 %
Neutro Abs: 1.8 10*3/uL (ref 1.7–7.7)
Platelets: 177 10*3/uL (ref 150–400)
RBC: 4.26 MIL/uL (ref 4.22–5.81)
RDW: 13.4 % (ref 11.5–15.5)
WBC: 3.4 10*3/uL — ABNORMAL LOW (ref 4.0–10.5)

## 2017-01-05 LAB — POC OCCULT BLOOD, ED: Fecal Occult Bld: POSITIVE — AB

## 2017-01-05 MED ORDER — POLYETHYLENE GLYCOL 3350 17 G PO PACK: 17.0000 g | PACK | Freq: Every day | ORAL | 0 refills | Status: DC

## 2017-01-05 MED ORDER — LIDOCAINE HCL 2 % EX GEL
1.0000 "application " | Freq: Once | CUTANEOUS | Status: AC
  Administered 2017-01-05: 1 via TOPICAL
  Filled 2017-01-05: qty 20

## 2017-01-05 MED ORDER — HYDROCORTISONE ACETATE 25 MG RE SUPP: 25.0000 mg | Freq: Two times a day (BID) | RECTAL | 0 refills | Status: DC

## 2017-01-05 NOTE — ED Triage Notes (Signed)
To ED for eval of blood in stool. Noticed a couple of days ago. States he has been constipated and had to strain to have bm. Used stool softeners yesterday without relief. Appears in nad

## 2017-01-05 NOTE — Discharge Instructions (Signed)
Your bleeding is likely due to hemorrhoid.  Take Miralax and use Anusol as prescribed.  Follow up with GI specialist for further evaluation.  Return to the ER if your condition worsen or if you have other concerns.
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
## 2017-01-05 NOTE — ED Notes (Signed)
Pt departed in NAD, refused use of wheelchair.  

## 2017-05-19 ENCOUNTER — Other Ambulatory Visit: Payer: Self-pay

## 2017-05-19 ENCOUNTER — Encounter (HOSPITAL_COMMUNITY): Payer: Self-pay | Admitting: Emergency Medicine

## 2017-05-19 ENCOUNTER — Ambulatory Visit (HOSPITAL_COMMUNITY)
Admission: EM | Admit: 2017-05-19 | Discharge: 2017-05-19 | Disposition: A | Discharge status: Home / Self Care | Payer: 59 | Attending: Urgent Care | Admitting: Urgent Care

## 2017-05-19 DIAGNOSIS — R062 Wheezing: Secondary | ICD-10-CM

## 2017-05-19 DIAGNOSIS — B9789 Other viral agents as the cause of diseases classified elsewhere: Secondary | ICD-10-CM | POA: Diagnosis not present

## 2017-05-19 DIAGNOSIS — R05 Cough: Secondary | ICD-10-CM

## 2017-05-19 DIAGNOSIS — J45909 Unspecified asthma, uncomplicated: Secondary | ICD-10-CM | POA: Diagnosis not present

## 2017-05-19 DIAGNOSIS — J069 Acute upper respiratory infection, unspecified: Secondary | ICD-10-CM

## 2017-05-19 DIAGNOSIS — Z9109 Other allergy status, other than to drugs and biological substances: Secondary | ICD-10-CM

## 2017-05-19 MED ORDER — PSEUDOEPHEDRINE HCL ER 120 MG PO TB12: 120.0000 mg | ORAL_TABLET | Freq: Two times a day (BID) | ORAL | 3 refills | Status: DC

## 2017-05-19 MED ORDER — ALBUTEROL SULFATE HFA 108 (90 BASE) MCG/ACT IN AERS: 1.0000 | INHALATION_SPRAY | Freq: Four times a day (QID) | RESPIRATORY_TRACT | 0 refills | Status: DC | PRN

## 2017-05-19 MED ORDER — BENZONATATE 100 MG PO CAPS: 100.0000 mg | ORAL_CAPSULE | Freq: Three times a day (TID) | ORAL | 0 refills | Status: DC | PRN

## 2017-05-19 MED ORDER — HYDROCODONE-HOMATROPINE 5-1.5 MG/5ML PO SYRP: 5.0000 mL | ORAL_SOLUTION | Freq: Every evening | ORAL | 0 refills | Status: DC | PRN

## 2017-05-19 MED ORDER — ALBUTEROL SULFATE (2.5 MG/3ML) 0.083% IN NEBU
INHALATION_SOLUTION | RESPIRATORY_TRACT | Status: AC
  Filled 2017-05-19: qty 3

## 2017-05-19 MED ORDER — IPRATROPIUM BROMIDE 0.02 % IN SOLN
0.5000 mg | Freq: Once | RESPIRATORY_TRACT | Status: AC
  Administered 2017-05-19: 0.5 mg via RESPIRATORY_TRACT

## 2017-05-19 MED ORDER — ALBUTEROL SULFATE (2.5 MG/3ML) 0.083% IN NEBU
2.5000 mg | INHALATION_SOLUTION | Freq: Once | RESPIRATORY_TRACT | Status: AC
  Administered 2017-05-19: 2.5 mg via RESPIRATORY_TRACT

## 2017-05-19 MED ORDER — IPRATROPIUM BROMIDE 0.02 % IN SOLN
RESPIRATORY_TRACT | Status: AC
  Filled 2017-05-19: qty 2.5

## 2017-05-19 MED ORDER — CETIRIZINE HCL 10 MG PO TABS: 10.0000 mg | ORAL_TABLET | Freq: Every day | ORAL | 11 refills | Status: AC

## 2017-05-19 NOTE — ED Provider Notes (Signed)
  MRN: [id number] DOB: [birth year]  Subjective:   Kenneth Frank is a 21 y.o. male presenting for productive cough, shob, wheezing, chest congestion, nasal congestion, headache, aching back. Also reports subjective fever, chills, sore throat. Does not have an albuterol inhaler that he has been able to use. Denies chest pain, sinus pain, ear pain, n/v, abdominal pain. Denies smoking cigarettes.  Kenneth Frank is not currently taking any medications and has No Known Allergies.  Kenneth Frank  has a past medical history of Allergy and Asthma. Also  has a past surgical history that includes Adenoidectomy and ORIF humerus fracture (Left, 04/15/2015).  Objective:   Vitals: BP 117/78   Pulse 99   Temp 99.2 F (37.3 C) (Oral)   Resp 20   SpO2 99%   Physical Exam  Constitutional: He is oriented to person, place, and time. He appears well-developed and well-nourished.  HENT:  Mouth/Throat: Oropharynx is clear and moist.  Eyes: Right eye exhibits no discharge. Left eye exhibits no discharge.  Neck: Normal range of motion. Neck supple.  Cardiovascular: Normal rate, regular rhythm and intact distal pulses. Exam reveals no gallop and no friction rub.  No murmur heard. Pulmonary/Chest: No respiratory distress. He has wheezes. He has no rales.  Lymphadenopathy:    He has no cervical adenopathy.  Neurological: He is alert and oriented to person, place, and time.  Skin: Skin is warm and dry.  Psychiatric: He has a normal mood and affect.   Assessment and Plan :   Viral URI with cough  Uncomplicated asthma, unspecified asthma severity, unspecified whether persistent  Environmental allergies  Will start supportive care for viral illness. Wheezing improved dramatically s/p nebulizer treatment. Refilled albuterol inhaler. Patient is to schedule this. Restart allergy medications including Zyrtec, Sudafed as needed for sinus congestion. Return-to-clinic precautions discussed, patient verbalized understanding.       Wallis BambergMani, Denay Pleitez, [place] JerseyPA-C 05/19/17 1606

## 2017-05-19 NOTE — ED Triage Notes (Signed)
Pt c/o chest congestion, coughing, nasal congestion. C/o hot cold chills. Back pain.

## 2017-05-19 NOTE — Discharge Instructions (Signed)
For sore throat try using a honey-based tea. Use 3 teaspoons of honey with juice squeezed from half lemon. Place shaved pieces of ginger into 1/2-1 cup of water and warm over stove top. Then mix the ingredients and repeat every 4 hours as needed. 

## 2017-06-16 ENCOUNTER — Other Ambulatory Visit: Payer: Self-pay | Admitting: Urgent Care

## 2017-06-17 NOTE — Telephone Encounter (Signed)
Medication refill. Thanks. 

## 2017-06-19 NOTE — Telephone Encounter (Signed)
Patient was seen at [facility]. [facility] if he would like to be seen here for refills. Although not a primary care practice, if he has a need for an albuterol inhaler, he is welcome [facility] for shortness of breath, wheezing, asthma flare.

## 2020-09-14 ENCOUNTER — Emergency Department (HOSPITAL_COMMUNITY): Payer: Self-pay

## 2020-09-14 ENCOUNTER — Observation Stay (HOSPITAL_COMMUNITY)
Admission: EM | Admit: 2020-09-14 | Discharge: 2020-09-15 | Disposition: A | Discharge status: Home / Self Care | Payer: Self-pay | Attending: Internal Medicine | Admitting: Internal Medicine

## 2020-09-14 ENCOUNTER — Other Ambulatory Visit: Payer: Self-pay

## 2020-09-14 ENCOUNTER — Encounter (HOSPITAL_COMMUNITY): Payer: Self-pay

## 2020-09-14 DIAGNOSIS — N179 Acute kidney failure, unspecified: Secondary | ICD-10-CM | POA: Insufficient documentation

## 2020-09-14 DIAGNOSIS — B20 Human immunodeficiency virus [HIV] disease: Secondary | ICD-10-CM | POA: Insufficient documentation

## 2020-09-14 DIAGNOSIS — J45909 Unspecified asthma, uncomplicated: Secondary | ICD-10-CM | POA: Insufficient documentation

## 2020-09-14 DIAGNOSIS — R9431 Abnormal electrocardiogram [ECG] [EKG]: Secondary | ICD-10-CM | POA: Diagnosis present

## 2020-09-14 DIAGNOSIS — Z8709 Personal history of other diseases of the respiratory system: Secondary | ICD-10-CM

## 2020-09-14 DIAGNOSIS — R079 Chest pain, unspecified: Secondary | ICD-10-CM | POA: Diagnosis present

## 2020-09-14 DIAGNOSIS — U071 COVID-19: Principal | ICD-10-CM | POA: Diagnosis present

## 2020-09-14 DIAGNOSIS — Z79899 Other long term (current) drug therapy: Secondary | ICD-10-CM | POA: Insufficient documentation

## 2020-09-14 DIAGNOSIS — Z21 Asymptomatic human immunodeficiency virus [HIV] infection status: Secondary | ICD-10-CM

## 2020-09-14 HISTORY — DX: Human immunodeficiency virus (HIV) disease: B20

## 2020-09-14 LAB — TROPONIN I (HIGH SENSITIVITY)
Troponin I (High Sensitivity): 7 ng/L (ref <18)
Troponin I (High Sensitivity): 6 ng/L (ref <18)
Troponin I (High Sensitivity): 6 ng/L (ref <18)

## 2020-09-14 LAB — CBC
HCT: 43.4 % (ref 39.0–52.0)
Hemoglobin: 14.5 g/dL (ref 13.0–17.0)
MCH: 30 pg (ref 26.0–34.0)
MCHC: 33.4 g/dL (ref 30.0–36.0)
MCV: 89.7 fL (ref 80.0–100.0)
Platelets: 207 10*3/uL (ref 150–400)
RBC: 4.84 MIL/uL (ref 4.22–5.81)
RDW: 12.5 % (ref 11.5–15.5)
WBC: 5.1 10*3/uL (ref 4.0–10.5)
nRBC: 0 % (ref 0.0–0.2)

## 2020-09-14 LAB — D-DIMER, QUANTITATIVE: D-Dimer, Quant: 0.36 ug/mL-FEU (ref 0.00–0.50)

## 2020-09-14 LAB — BASIC METABOLIC PANEL
Anion gap: 9 (ref 5–15)
BUN: 12 mg/dL (ref 6–20)
CO2: 28 mmol/L (ref 22–32)
Calcium: 9.1 mg/dL (ref 8.9–10.3)
Chloride: 103 mmol/L (ref 98–111)
Creatinine, Ser: 1.31 mg/dL — ABNORMAL HIGH (ref 0.61–1.24)
GFR, Estimated: >60 mL/min (ref >60)
Glucose, Bld: 96 mg/dL (ref 70–99)
Potassium: 3.6 mmol/L (ref 3.5–5.1)
Sodium: 140 mmol/L (ref 135–145)

## 2020-09-14 LAB — RESP PANEL BY RT-PCR (FLU A&B, COVID) ARPGX2
Influenza A by PCR: NEGATIVE
Influenza B by PCR: NEGATIVE
SARS Coronavirus 2 by RT PCR: POSITIVE — AB

## 2020-09-14 LAB — LACTATE DEHYDROGENASE: LDH: 142 U/L (ref 98–192)

## 2020-09-14 LAB — FIBRINOGEN: Fibrinogen: 341 mg/dL (ref 210–475)

## 2020-09-14 LAB — HEPATITIS B SURFACE ANTIGEN: Hepatitis B Surface Ag: NONREACTIVE

## 2020-09-14 LAB — SEDIMENTATION RATE: Sed Rate: 2 mm/hr (ref 0–16)

## 2020-09-14 LAB — PROCALCITONIN: Procalcitonin: <0.1 ng/mL

## 2020-09-14 LAB — C-REACTIVE PROTEIN: CRP: 0.5 mg/dL (ref <1.0)

## 2020-09-14 MED ORDER — ACETAMINOPHEN 650 MG RE SUPP: 650.0000 mg | Freq: Four times a day (QID) | RECTAL | Status: DC | PRN

## 2020-09-14 MED ORDER — AEROCHAMBER PLUS FLO-VU MISC
1.0000 | Freq: Once | Status: AC
  Administered 2020-09-14: 1

## 2020-09-14 MED ORDER — NITROGLYCERIN 0.4 MG SL SUBL
0.4000 mg | SUBLINGUAL_TABLET | SUBLINGUAL | Status: DC | PRN
  Administered 2020-09-14 (×3): 0.4 mg via SUBLINGUAL
  Filled 2020-09-14: qty 1

## 2020-09-14 MED ORDER — LORATADINE 10 MG PO TABS
10.0000 mg | ORAL_TABLET | Freq: Every day | ORAL | Status: DC
  Administered 2020-09-14 – 2020-09-15 (×2): 10 mg via ORAL
  Filled 2020-09-14 (×2): qty 1

## 2020-09-14 MED ORDER — FLUTICASONE-SALMETEROL 100-50 MCG/DOSE IN AEPB: 1.0000 | INHALATION_SPRAY | Freq: Two times a day (BID) | RESPIRATORY_TRACT | Status: DC

## 2020-09-14 MED ORDER — ENOXAPARIN SODIUM 40 MG/0.4ML ~~LOC~~ SOLN
40.0000 mg | SUBCUTANEOUS | Status: DC
  Administered 2020-09-14: 40 mg via SUBCUTANEOUS
  Filled 2020-09-14: qty 0.4

## 2020-09-14 MED ORDER — ASCORBIC ACID 500 MG PO TABS
500.0000 mg | ORAL_TABLET | Freq: Every day | ORAL | Status: DC
  Administered 2020-09-14 – 2020-09-15 (×2): 500 mg via ORAL
  Filled 2020-09-14 (×2): qty 1

## 2020-09-14 MED ORDER — ONDANSETRON HCL 4 MG PO TABS: 4.0000 mg | ORAL_TABLET | Freq: Four times a day (QID) | ORAL | Status: DC | PRN

## 2020-09-14 MED ORDER — SODIUM CHLORIDE 0.9 % IV SOLN
200.0000 mg | Freq: Once | INTRAVENOUS | Status: AC
  Administered 2020-09-15: 200 mg via INTRAVENOUS
  Filled 2020-09-14: qty 40

## 2020-09-14 MED ORDER — SENNOSIDES-DOCUSATE SODIUM 8.6-50 MG PO TABS: 1.0000 | ORAL_TABLET | Freq: Every evening | ORAL | Status: DC | PRN

## 2020-09-14 MED ORDER — BICTEGRAVIR-EMTRICITAB-TENOFOV 50-200-25 MG PO TABS
1.0000 | ORAL_TABLET | Freq: Every day | ORAL | Status: DC
  Administered 2020-09-14: 1 via ORAL
  Filled 2020-09-14 (×2): qty 1

## 2020-09-14 MED ORDER — ONDANSETRON HCL 4 MG/2ML IJ SOLN: 4.0000 mg | Freq: Four times a day (QID) | INTRAMUSCULAR | Status: DC | PRN

## 2020-09-14 MED ORDER — IOHEXOL 350 MG/ML SOLN
100.0000 mL | Freq: Once | INTRAVENOUS | Status: AC | PRN
  Administered 2020-09-14: 100 mL via INTRAVENOUS

## 2020-09-14 MED ORDER — ALBUTEROL SULFATE HFA 108 (90 BASE) MCG/ACT IN AERS
1.0000 | INHALATION_SPRAY | Freq: Four times a day (QID) | RESPIRATORY_TRACT | Status: DC | PRN
  Filled 2020-09-14: qty 6.7

## 2020-09-14 MED ORDER — HYDROCODONE-HOMATROPINE 5-1.5 MG/5ML PO SYRP: 5.0000 mL | ORAL_SOLUTION | Freq: Four times a day (QID) | ORAL | Status: DC | PRN

## 2020-09-14 MED ORDER — ACETAMINOPHEN 325 MG PO TABS: 650.0000 mg | ORAL_TABLET | Freq: Four times a day (QID) | ORAL | Status: DC | PRN

## 2020-09-14 MED ORDER — SODIUM CHLORIDE 0.9 % IV SOLN
100.0000 mg | Freq: Every day | INTRAVENOUS | Status: AC
  Administered 2020-09-15: 100 mg via INTRAVENOUS
  Filled 2020-09-14: qty 20

## 2020-09-14 MED ORDER — ALBUTEROL SULFATE HFA 108 (90 BASE) MCG/ACT IN AERS
6.0000 | INHALATION_SPRAY | Freq: Once | RESPIRATORY_TRACT | Status: AC
  Administered 2020-09-14: 6 via RESPIRATORY_TRACT
  Filled 2020-09-14: qty 6.7

## 2020-09-14 MED ORDER — BENZONATATE 100 MG PO CAPS
100.0000 mg | ORAL_CAPSULE | Freq: Three times a day (TID) | ORAL | Status: DC | PRN
  Administered 2020-09-15: 100 mg via ORAL
  Filled 2020-09-14: qty 1

## 2020-09-14 MED ORDER — ASPIRIN 81 MG PO CHEW
324.0000 mg | CHEWABLE_TABLET | Freq: Once | ORAL | Status: AC
  Administered 2020-09-14: 324 mg via ORAL
  Filled 2020-09-14: qty 4

## 2020-09-14 MED ORDER — SODIUM CHLORIDE 0.45 % IV SOLN: INTRAVENOUS | Status: DC

## 2020-09-14 MED ORDER — MONTELUKAST SODIUM 5 MG PO CHEW: 5.0000 mg | CHEWABLE_TABLET | Freq: Every day | ORAL | Status: DC

## 2020-09-14 MED ORDER — ZINC SULFATE 220 (50 ZN) MG PO CAPS
220.0000 mg | ORAL_CAPSULE | Freq: Every day | ORAL | Status: DC
  Administered 2020-09-14 – 2020-09-15 (×2): 220 mg via ORAL
  Filled 2020-09-14 (×2): qty 1

## 2020-09-14 MED ORDER — NITROGLYCERIN IN D5W 200-5 MCG/ML-% IV SOLN
0.0000 ug/min | INTRAVENOUS | Status: DC
  Administered 2020-09-14: 10 ug/min via INTRAVENOUS
  Filled 2020-09-14: qty 250

## 2020-09-14 NOTE — ED Notes (Signed)
Patient transported to CT 

## 2020-09-14 NOTE — H&P (Addendum)
[facility] History & Physical  Kelly Splinter MD  Referring Provider:   Greysyn Vanderberg 09/14/2020  Chief Complaint: Chest pain, recent + COVID test HPI: The patient is a 25 y.o. year-old w/ hx of asthma and HIV. Pt went to urgent care on Tuesday 4/12 for headache and body aches. He tested + for COVID and was given Z-pak and prednisone but symptoms did not improve.  He then developed chest pain, like someone standing on his cest w/ associated SOB, cough and wheezing. He took tylenol for headaches and fevers w/ some relief but today chest pressure was worse so came to ED. In ED BP's were on the high side, HR 50's, RR off and on, afeb and SpO2's stable. Creat 1.3 (b/l 1.0), CBC wnl, hxTrop 7. EKG showed inferolateral TWI/ submm STD , LVH w/ repol changes. CXR is negative. Pt was given albuterol and sl NTG x 3 and started on nitroglycerin IV gtt. Cardiology consulted. They ordered ESR/ CRP which were wnl. Echo and CTA for PE have also been ordered w/o results yet. Appreciate cardiology assistance. We are asked to see for admission.    Pt seen in ED.  HIV x 2- 79yr, [facility] w/ Cd4 450.  No serious complications per the ID notes or per the patient. Hx asthma since "childhood", not severe.   Chest pressure in mid chest, worse when lying flat and better when sitting up. Not related to cough. Cough is productive of mild phlegm w/o color. No SOB at rest.  No abd pain, eating "ok". Had N/V 2 days ago, none since. No diarrhea.     ROS  no joint pain   no HA  no blurry vision  no rash  no dysuria  no difficulty voiding  no change in urine color   Past Medical History  Past Medical History:  Diagnosis Date  . Allergy    seasonal  . Asthma   . HIV (human immunodeficiency virus infection) ([facility]    Past Surgical History  Past Surgical History:  Procedure Laterality Date  . ADENOIDECTOMY    . ORIF HUMERUS FRACTURE Left 04/15/2015   Procedure: OPEN REDUCTION  INTERNAL FIXATION (ORIF) HUMERAL SHAFT FRACTURE;  Surgeon: CMcarthur Rossetti MD;  [facility]  Service: Orthopedics;  Laterality: Left;   Family History  Family History  Problem Relation Age of Onset  . Cancer Maternal Grandmother    Social History  reports that he has never smoked. He has never used smokeless tobacco. He reports that he does not drink alcohol and does not use drugs. Allergies No Known Allergies Home medications Prior to Admission medications   Medication Sig Start Date End Date Taking? Authorizing Provider  albuterol (PROVENTIL HFA;VENTOLIN HFA) 108 (90 Base) MCG/ACT inhaler Inhale 1-2 puffs into the lungs every 6 (six) hours as needed for wheezing or shortness of breath. 05/19/17   MJaynee Eagles PA-C  benzonatate (TESSALON) 100 MG capsule Take 1-2 capsules (100-200 mg total) by mouth 3 (three) times daily as needed for cough. 05/19/17   MJaynee Eagles PA-C  cetirizine (ZYRTEC ALLERGY) 10 MG tablet Take 1 tablet (10 mg total) by mouth daily. 05/19/17   MJaynee Eagles PA-C  fluticasone (FLONASE) 50 MCG/ACT nasal spray Place 1 spray into the nose daily.    [provider]  Fluticasone-Salmeterol (ADVAIR DISKUS IN) Inhale 1 puff into the lungs 2 (two) times daily.    [provider]  HYDROcodone-homatropine (HYCODAN) 5-1.5 MG/5ML syrup  Take 5 mLs by mouth at bedtime as needed. 05/19/17   Jaynee Eagles, PA-C  montelukast (SINGULAIR) 5 MG chewable tablet Chew 5 mg by mouth daily.    [provider]  pseudoephedrine (SUDAFED 12 HOUR) 120 MG 12 hr tablet Take 1 tablet (120 mg total) by mouth 2 (two) times daily. 05/19/17   Jaynee Eagles, PA-C       Exam Gen alert, no distress No rash, cyanosis or gangrene Sclera anicteric, throat clear  No jvd or bruits Chest clear bilat to bases, no rales/ wheezing RRR no MRG Abd soft ntnd no mass or ascites +bs GU normal MS no joint effusions or deformity Ext no LE or UE edema, no wounds or ulcers Neuro is  alert, Ox 3 , nf       Home meds:  - advair diskus 1 puff bid/ singulair 13m qd  - biktarvy 1 daily  - prn's/ vitamins/ supplements      Assessment/ Plan: 1. Chest pain - in setting of acute COVID infection w/ assoc potential EKG changes.  Seen by cardiology, hsTrop negative x 1. ESR and CRP ordered and wnl. Cardiology requesting CTA of chest for PE and also echocardiogram.  NTG gtt not felt to be needed per cardiology, dc'd in ED.  2. HIV - f/b [facility] ID team. Compliant w/ HIV medication. Cont biktarvy.  3. COVID +infection - +symptoms and at risk for worsening w/ HIV as risk factor, but not hypoxic. Recommended shortened course 3 days of remdesivir, patient is agreeable to short course. Get labs in am.   4. H/o asthma - no wheezing in ED today. B-agonists prn, cont home meds      RKelly Splinter MD 09/14/2020, 7:10 PM

## 2020-09-14 NOTE — ED Notes (Addendum)
Report given to megan rn.

## 2020-09-14 NOTE — ED Triage Notes (Signed)
Pt arrived to triage c/o chest pain and increased shortness of breath. diagnosed with COVID 2 days ago  Hx of asthma has run out of his inhalers.   Pt took tylenol for head aches and fevers with some relief but today chest pressure was worse.

## 2020-09-14 NOTE — ED Notes (Signed)
Admitting at bedside 

## 2020-09-14 NOTE — ED Notes (Addendum)
Lab called to verify add on for sed rate and c reactive order.
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
## 2020-09-14 NOTE — Consult Note (Addendum)
Cardiology Consultation:   Patient ID: Kenneth Frank MRN: [id number]; DOB: 05-01-1996  Admit date: 09/14/2020 Date of Consult: 09/14/2020  PCP:  Patient, No Pcp Per (Inactive)   [place]  Cardiologist:  New to [place]; Dr. Gasper Sells Advanced Practice Provider:  No care team member to display Electrophysiologist:  None   Patient Profile:   Chirstopher Frank is a 25 y.o. male with a PMH of asthma, HIV. and recent diagnosis of COVID-19 at an [place] facility 09/12/20, who is being seen today for the evaluation of chest pain at the request of Dr. Dina Rich.  History of Present Illness:   Mr. Mollett was in his usual state of health until 3 days ago when he began experiencing headache and body aches. He was seen at an urgent care facility 09/11/20 and diagnosed with COVID-19. He was prescribed a Z-pak and prednisone without significant improvement in symptoms. He reported constant chest pain since that time, feeling like someone is standing on his chest. He had SOB, cough, and wheezing. Given progression of symptoms, patient presented to the ED for further evaluation.   He has no prior cardiac history. No previous cardiac testing. He drinks socially but denies tobacco or illicit drug use. He denies significant family history of CAD or CHF, reports grandmother has a PPM.   ED course: intermittently hypertensive, mildly bradycardic in the 50s, intermittently tachypneic, afebrile, O2 sats stable. Labs notable for K 3.6, Cr 1.31 (baseline 0.9-1), CBC wnl, HsTrop 7. EKG this admission shows sinus rhythm, 63 bpm, inferolateral TWI/submm STD, LVH with repol abnormalities. CXR showed no acute findings. He was given albuterol, as well as SL nitro x3 and started on a nitro gtt. Cardiology asked to evaluate.   At the time of this evaluation he reports ongoing chest pain feeling like someone is standing on his chest. He reports pain is worse with laying down, coughing, and deep breaths. He has  had worsening SOB, wheezing, and coughing. He has had some orthopnea. No complaints of associated dizziness, lightheadedness, syncope, palpitations, or diaphoresis. He has no prior history of chest pain, CAD, or CHF. Prior to his COVID diagnosis he had no complaints of SOB, DOE, orthopnea, PND, or LE edema.    Past Medical History:  Diagnosis Date  . Allergy    seasonal  . Asthma   . HIV (human immunodeficiency virus infection) ([place])     Past Surgical History:  Procedure Laterality Date  . ADENOIDECTOMY    . ORIF HUMERUS FRACTURE Left 04/15/2015   Procedure: OPEN REDUCTION INTERNAL FIXATION (ORIF) HUMERAL SHAFT FRACTURE;  Surgeon: Mcarthur Rossetti, MD;  Location: [facility];  Service: Orthopedics;  Laterality: Left;     Home Medications:  Prior to Admission medications   Medication Sig Start Date End Date Taking? Authorizing Provider  albuterol (PROVENTIL HFA;VENTOLIN HFA) 108 (90 Base) MCG/ACT inhaler Inhale 1-2 puffs into the lungs every 6 (six) hours as needed for wheezing or shortness of breath. 05/19/17   Jaynee Eagles, PA-C  benzonatate (TESSALON) 100 MG capsule Take 1-2 capsules (100-200 mg total) by mouth 3 (three) times daily as needed for cough. 05/19/17   Jaynee Eagles, PA-C  cetirizine (ZYRTEC ALLERGY) 10 MG tablet Take 1 tablet (10 mg total) by mouth daily. 05/19/17   Jaynee Eagles, PA-C  fluticasone (FLONASE) 50 MCG/ACT nasal spray Place 1 spray into the nose daily.    [provider]  Fluticasone-Salmeterol (ADVAIR DISKUS IN) Inhale 1 puff into the lungs 2 (two) times daily.  [provider]  HYDROcodone-homatropine (HYCODAN) 5-1.5 MG/5ML syrup Take 5 mLs by mouth at bedtime as needed. 05/19/17   Jaynee Eagles, PA-C  montelukast (SINGULAIR) 5 MG chewable tablet Chew 5 mg by mouth daily.    [provider]  pseudoephedrine (SUDAFED 12 HOUR) 120 MG 12 hr tablet Take 1 tablet (120 mg total) by mouth 2 (two) times daily. 05/19/17   Jaynee Eagles, PA-C     Inpatient Medications: Scheduled Meds:   Continuous Infusions: . nitroGLYCERIN 10 mcg/min (09/14/20 1501)   PRN Meds:   Allergies:   No Known Allergies  Social History:   Social History   Socioeconomic History  . Marital status: Single    Spouse name: Not on file  . Number of children: Not on file  . Years of education: Not on file  . Highest education level: Not on file  Occupational History  . Not on file  Tobacco Use  . Smoking status: Never Smoker  . Smokeless tobacco: Never Used  Substance and Sexual Activity  . Alcohol use: No  . Drug use: No  . Sexual activity: Yes  Other Topics Concern  . Not on file  Social History Narrative  . Not on file   Social Determinants of Health   Financial Resource Strain: Not on file  Food Insecurity: Not on file  Transportation Needs: Not on file  Physical Activity: Not on file  Stress: Not on file  Social Connections: Not on file  Intimate Partner Violence: Not on file    Family History:    Family History  Problem Relation Age of Onset  . Cancer Maternal Grandmother      ROS:  Please see the history of present illness.   All other ROS reviewed and negative.     Physical Exam/Data:   Vitals:   09/14/20 1515 09/14/20 1530 09/14/20 1600 09/14/20 1715  BP: (!) 119/107 (!) 116/91 (!) 111/51 108/75  Pulse: (!) 51 (!) 57 (!) 50 (!) 52  Resp: 14 14 18 10   Temp:      SpO2: 95% 99% 100% 100%  Weight:      Height:       No intake or output data in the 24 hours ending 09/14/20 1722 Last 3 Weights 09/14/2020 04/11/2015 04/08/2015  Weight (lbs) 180 lb 144 lb 8 oz 156 lb 4.9 oz  Weight (kg) 81.647 kg 65.545 kg 70.9 kg     Body mass index is 28.19 kg/m.   History obtained over the telephone. General:  In no acute distress Cardiac:  No LE edema Lungs:  Speaking in full sentences without SOB; occasional coughing Neuro:  No slurred speech Psych:  Normal affect   EKG:  The EKG was personally reviewed and  demonstrates:  sinus rhythm, 63 bpm, inferolateral TWI/submm STD, LVH with repol abnormalities  Relevant CV Studies: None  Laboratory Data:  High Sensitivity Troponin:   Recent Labs  Lab 09/14/20 0943 09/14/20 1549  TROPONINIHS 7 6     Chemistry Recent Labs  Lab 09/14/20 0943  NA 140  K 3.6  CL 103  CO2 28  GLUCOSE 96  BUN 12  CREATININE 1.31*  CALCIUM 9.1  GFRNONAA >60  ANIONGAP 9    No results for input(s): PROT, ALBUMIN, AST, ALT, ALKPHOS, BILITOT in the last 168 hours. Hematology Recent Labs  Lab 09/14/20 0943  WBC 5.1  RBC 4.84  HGB 14.5  HCT 43.4  MCV 89.7  MCH 30.0  MCHC 33.4  RDW 12.5  PLT 207   BNPNo results for input(s): BNP, PROBNP in the last 168 hours.  DDimer No results for input(s): DDIMER in the last 168 hours.   Radiology/Studies:  DG Chest Port 1 View  Result Date: 09/14/2020 CLINICAL DATA:  Chest pain COVID 19 positivity with shortness of breath EXAM: PORTABLE CHEST 1 VIEW COMPARISON:  04/05/2015 FINDINGS: The heart size and mediastinal contours are within normal limits. Both lungs are clear. The visualized skeletal structures are unremarkable. IMPRESSION: No active disease. Electronically Signed   By: Inez Catalina M.D.   On: 09/14/2020 09:52     Assessment and Plan:   1. Chest pain in patient with recently diagnosed COVID-19: patient was diagnosed with COVID-19 09/11/20 after developing headache/body aches the day prior. He was started on a Z-pak and prednisone, however subsequently developed chest pain and SOB prompting him to present to the ED. EKG shows sinus rhythm with LVH and repol abnormalities, inferolateral TWI, no concerning STE. HsTrop is negative x1.  - Will add on ESR/CRP - Will check an echocardiogram to evaluate LV function, wall motion, and evidence for a pericardial effusion. - Low threshold to initiate colchicine + ibuprofen if echo c/f pericarditis or inflammatory markers elevated - Low threshold for CT PE study  - No  indication for nitro gtt at this time.   2. COVID-19 infection: diagnosed 09/11/20 at an urgent care facility. Worsening SOB, cough, and wheezing since that time. CXR without acute findings. WBC wnl. He remains afebrile - Continue supportive care per primary team  3. HIV: follows with ID outpatient - Continue management per primary team   Risk Assessment/Risk Scores:   HEAR Score (for undifferentiated chest pain):  HEAR Score: 1          For questions or updates, please contact [place] Please consult [email or web address] for contact info under    Signed, Abigail Butts, PA-C  09/14/2020 5:22 PM   Personally seen and examined. Agree with APP above with the following comments: Briefly 24 yo M with history of HIV on HAART with new CP in the setting of COVID-19. Patient notes that he has had persistent CP for this AM.  Has had fatigue and weakness  Exam notable for no RV heave. Labs notable for  Normal troponin and markers of inflamation Personally reviewed relevant tests; sinus bradycardia- ECG appears normal variant for AA male Would recommend CTPE; discussed with ED team  Rudean Haskell, [place]  [address] [place], [place] [address] [phone]  6:16 PM

## 2020-09-15 ENCOUNTER — Observation Stay (HOSPITAL_BASED_OUTPATIENT_CLINIC_OR_DEPARTMENT_OTHER): Payer: Self-pay

## 2020-09-15 DIAGNOSIS — R079 Chest pain, unspecified: Secondary | ICD-10-CM

## 2020-09-15 DIAGNOSIS — R0789 Other chest pain: Secondary | ICD-10-CM

## 2020-09-15 DIAGNOSIS — Z8709 Personal history of other diseases of the respiratory system: Secondary | ICD-10-CM

## 2020-09-15 DIAGNOSIS — B20 Human immunodeficiency virus [HIV] disease: Secondary | ICD-10-CM

## 2020-09-15 LAB — ECHOCARDIOGRAM COMPLETE
AR max vel: 3.31 cm2
AV Area VTI: 3.47 cm2
AV Area mean vel: 3.62 cm2
AV Mean grad: 3 mmHg
AV Peak grad: 5.9 mmHg
Ao pk vel: 1.21 m/s
Area-P 1/2: 3.48 cm2
Calc EF: 50.7 %
Height: 67 in
S' Lateral: 3.5 cm
Single Plane A2C EF: 53.9 %
Single Plane A4C EF: 47.1 %
Weight: 2960 oz

## 2020-09-15 LAB — BASIC METABOLIC PANEL
Anion gap: 4 — ABNORMAL LOW (ref 5–15)
BUN: 13 mg/dL (ref 6–20)
CO2: 28 mmol/L (ref 22–32)
Calcium: 8.4 mg/dL — ABNORMAL LOW (ref 8.9–10.3)
Chloride: 105 mmol/L (ref 98–111)
Creatinine, Ser: 1.32 mg/dL — ABNORMAL HIGH (ref 0.61–1.24)
GFR, Estimated: >60 mL/min (ref >60)
Glucose, Bld: 93 mg/dL (ref 70–99)
Potassium: 3.5 mmol/L (ref 3.5–5.1)
Sodium: 137 mmol/L (ref 135–145)

## 2020-09-15 MED ORDER — ALBUTEROL SULFATE HFA 108 (90 BASE) MCG/ACT IN AERS: 1.0000 | INHALATION_SPRAY | Freq: Four times a day (QID) | RESPIRATORY_TRACT | 0 refills | Status: AC | PRN

## 2020-09-15 MED ORDER — BICTEGRAVIR-EMTRICITAB-TENOFOV 50-200-25 MG PO TABS
1.0000 | ORAL_TABLET | Freq: Every day | ORAL | Status: DC
  Filled 2020-09-15: qty 1

## 2020-09-15 MED ORDER — ACETAMINOPHEN 325 MG PO TABS: 650.0000 mg | ORAL_TABLET | Freq: Four times a day (QID) | ORAL | Status: AC | PRN

## 2020-09-15 MED ORDER — BENZONATATE 100 MG PO CAPS: 100.0000 mg | ORAL_CAPSULE | Freq: Three times a day (TID) | ORAL | 0 refills | Status: AC | PRN

## 2020-09-15 NOTE — Care Management (Signed)
1501 09-15-20 Case Manager spoke with patient and he states he sees a provider at [facility]. Case Manager discussed with patient to see if the patient would be able to afford medications- patient states that he has support with purchasing medications. No further needs from Case Manager at this time. Gala Lewandowsky, RN,BSN Case Manager

## 2020-09-15 NOTE — Progress Notes (Signed)
Progress Note  Patient Name: Kenneth Frank Date of Encounter: 09/15/2020  Primary Cardiologist: New to Rudean Haskell MD  Subjective   Negative CTPE overnight without coronary calcium.  No change in CP.    Inpatient Medications    Scheduled Meds: . vitamin C  500 mg Oral Daily  . bictegravir-emtricitabine-tenofovir AF  1 tablet Oral QHS  . enoxaparin (LOVENOX) injection  40 mg Subcutaneous Q24H  . loratadine  10 mg Oral Daily  . zinc sulfate  220 mg Oral Daily   Continuous Infusions: . sodium chloride 100 mL/hr at 09/15/20 0400  . remdesivir 100 mg in NS 100 mL     PRN Meds: acetaminophen **OR** acetaminophen, albuterol, benzonatate, ondansetron **OR** ondansetron (ZOFRAN) IV, senna-docusate   Vital Signs    Vitals:   09/15/20 0211 09/15/20 0540 09/15/20 0756 09/15/20 1155  BP: 126/85 111/70 121/82 118/79  Pulse: 62 (!) 54 77 (!) 52  Resp: 16 12 13 14   Temp: 98.1 F (36.7 C) 98 F (36.7 C) 97.6 F (36.4 C) 98.6 F (37 C)  TempSrc: Oral Oral Axillary Oral  SpO2: 100% 98% 99% 100%  Weight: 83.9 kg     Height: 5' 7"  (1.702 m)       Intake/Output Summary (Last 24 hours) at 09/15/2020 1250 Last data filed at 09/15/2020 0930 Gross per 24 hour  Intake 502.61 ml  Output 300 ml  Net 202.61 ml   Filed Weights   09/14/20 0903 09/15/20 0211  Weight: 81.6 kg 83.9 kg    Telemetry    Sinus bradycardia predominantly - Personally Reviewed  ECG    SR lateral TWI rate 69 - scanned in from 09/14/20 - Personally Reviewed  Physical Exam   GEN: No acute distress.   Neck: No JVD Cardiac: RRR, no murmurs, rubs, or gallops.  Respiratory: Clear to auscultation bilaterally with poor effort GI: Soft, nontender, non-distended  MS: No edema; No deformity. Neuro:  Nonfocal  Psych: Normal affect   Labs    Chemistry Recent Labs  Lab 09/14/20 0943 09/15/20 0341  NA 140 137  K 3.6 3.5  CL 103 105  CO2 28 28  GLUCOSE 96 93  BUN 12 13  CREATININE 1.31* 1.32*   CALCIUM 9.1 8.4*  GFRNONAA >60 >60  ANIONGAP 9 4*     Hematology Recent Labs  Lab 09/14/20 0943  WBC 5.1  RBC 4.84  HGB 14.5  HCT 43.4  MCV 89.7  MCH 30.0  MCHC 33.4  RDW 12.5  PLT 207    Cardiac EnzymesNo results for input(s): TROPONINI in the last 168 hours. No results for input(s): TROPIPOC in the last 168 hours.   BNPNo results for input(s): BNP, PROBNP in the last 168 hours.   DDimer  Recent Labs  Lab 09/14/20 2018  DDIMER 0.36     Radiology    CT Angio Chest PE W and/or Wo Contrast  Result Date: 09/14/2020 CLINICAL DATA:  PE suspected, recent diagnosis of COVID, chest pain EXAM: CT ANGIOGRAPHY CHEST WITH CONTRAST TECHNIQUE: Multidetector CT imaging of the chest was performed using the standard protocol during bolus administration of intravenous contrast. Multiplanar CT image reconstructions and MIPs were obtained to evaluate the vascular anatomy. CONTRAST:  136m OMNIPAQUE IOHEXOL 350 MG/ML SOLN COMPARISON:  None. FINDINGS: Cardiovascular: Satisfactory opacification of the pulmonary arteries to the segmental level. No evidence of pulmonary embolism. Normal heart size. No pericardial effusion. Mediastinum/Nodes: No enlarged mediastinal, hilar, or axillary lymph nodes. Thyroid gland, trachea, and esophagus demonstrate  no significant findings. Lungs/Pleura: Diffuse bilateral bronchial wall thickening. Trace bilateral pleural effusions. Upper Abdomen: No acute abnormality. Musculoskeletal: No chest wall abnormality. No acute or significant osseous findings. Review of the MIP images confirms the above findings. IMPRESSION: 1. Negative examination for pulmonary embolism. 2. Diffuse bilateral bronchial wall thickening, consistent with nonspecific infectious or inflammatory bronchitis. 3. Trace bilateral pleural effusions. 4. No acute airspace disease per specifically reported history of COVID. Electronically Signed   By: Eddie Candle M.D.   On: 09/14/2020 19:25   DG Chest Port 1  View  Result Date: 09/14/2020 CLINICAL DATA:  Chest pain COVID 19 positivity with shortness of breath EXAM: PORTABLE CHEST 1 VIEW COMPARISON:  04/05/2015 FINDINGS: The heart size and mediastinal contours are within normal limits. Both lungs are clear. The visualized skeletal structures are unremarkable. IMPRESSION: No active disease. Electronically Signed   By: Inez Catalina M.D.   On: 09/14/2020 09:52   ECHOCARDIOGRAM COMPLETE  Result Date: 09/15/2020    ECHOCARDIOGRAM REPORT   Patient Name:   Kenneth Frank Date of Exam: 09/15/2020 Medical Rec #:  [id number]  Height:       67.0 in Accession #:    [id number] Weight:       185.0 lb Date of Birth:  [birth year]  BSA:          1.956 m Patient Age:    25 years   BP:           121/82 mmHg Patient Gender: M          HR:           50 bpm. Exam Location:  Inpatient Procedure: 2D Echo, Cardiac Doppler and Color Doppler Indications:    Chest pain  History:        Patient has no prior history of Echocardiogram examinations.  Sonographer:    Luisa Hart RDCS Referring Phys: [id number] [facility]  Sonographer Comments: No cardiac procedures or surgeries were indicated on the chart. IMPRESSIONS  1. Left ventricular ejection fraction, by estimation, is 55 to 60%. The left ventricle has normal function. The left ventricle has no regional wall motion abnormalities. Left ventricular diastolic parameters were normal.  2. Right ventricular systolic function is normal. The right ventricular size is normal. There is normal pulmonary artery systolic pressure.  3. The mitral valve is normal in structure. No evidence of mitral valve regurgitation. No evidence of mitral stenosis.  4. The aortic valve is tricuspid. Aortic valve regurgitation is not visualized. No aortic stenosis is present.  5. The inferior vena cava is normal in size with greater than 50% respiratory variability, suggesting right atrial pressure of 3 mmHg. Comparison(s): No prior Echocardiogram. Conclusion(s)/Recommendation(s):  Normal biventricular function without evidence of hemodynamically significant valvular heart disease. FINDINGS  Left Ventricle: Left ventricular ejection fraction, by estimation, is 55 to 60%. The left ventricle has normal function. The left ventricle has no regional wall motion abnormalities. The left ventricular internal cavity size was normal in size. There is  no left ventricular hypertrophy. Left ventricular diastolic parameters were normal. Right Ventricle: The right ventricular size is normal. No increase in right ventricular wall thickness. Right ventricular systolic function is normal. There is normal pulmonary artery systolic pressure. The tricuspid regurgitant velocity is 2.09 m/s, and  with an assumed right atrial pressure of 3 mmHg, the estimated right ventricular systolic pressure is 63.8 mmHg. Left Atrium: Left atrial size was normal in size. Right Atrium: Right atrial size was normal in size. Pericardium: There  is no evidence of pericardial effusion. Mitral Valve: The mitral valve is normal in structure. No evidence of mitral valve regurgitation. No evidence of mitral valve stenosis. Tricuspid Valve: The tricuspid valve is normal in structure. Tricuspid valve regurgitation is trivial. Aortic Valve: The aortic valve is tricuspid. Aortic valve regurgitation is not visualized. No aortic stenosis is present. Aortic valve mean gradient measures 3.0 mmHg. Aortic valve peak gradient measures 5.9 mmHg. Aortic valve area, by VTI measures 3.47 cm. Pulmonic Valve: The pulmonic valve was grossly normal. Pulmonic valve regurgitation is mild to moderate. No evidence of pulmonic stenosis. Aorta: The aortic root, ascending aorta and aortic arch are all structurally normal, with no evidence of dilitation or obstruction. Venous: The inferior vena cava is normal in size with greater than 50% respiratory variability, suggesting right atrial pressure of 3 mmHg. IAS/Shunts: No atrial level shunt detected by color flow  Doppler.  LEFT VENTRICLE PLAX 2D LVIDd:         4.90 cm     Diastology LVIDs:         3.50 cm     LV e' medial:    9.03 cm/s LV PW:         1.10 cm     LV E/e' medial:  8.3 LV IVS:        1.00 cm     LV e' lateral:   10.20 cm/s LVOT diam:     2.50 cm     LV E/e' lateral: 7.4 LV SV:         91 LV SV Index:   46 LVOT Area:     4.91 cm  LV Volumes (MOD) LV vol d, MOD A2C: 86.4 ml LV vol d, MOD A4C: 65.0 ml LV vol s, MOD A2C: 39.8 ml LV vol s, MOD A4C: 34.4 ml LV SV MOD A2C:     46.6 ml LV SV MOD A4C:     65.0 ml LV SV MOD BP:      39.3 ml RIGHT VENTRICLE RV S prime:     10.30 cm/s TAPSE (M-mode): 2.2 cm LEFT ATRIUM             Index       RIGHT ATRIUM           Index LA Vol (A2C):   28.1 ml 14.36 ml/m RA Area:     16.00 cm LA Vol (A4C):   33.1 ml 16.92 ml/m RA Volume:   38.90 ml  19.88 ml/m LA Biplane Vol: 33.9 ml 17.33 ml/m  AORTIC VALVE                   PULMONIC VALVE AV Area (Vmax):    3.31 cm    PV Vmax:       0.83 m/s AV Area (Vmean):   3.62 cm    PV Vmean:      65.700 cm/s AV Area (VTI):     3.47 cm    PV VTI:        0.214 m AV Vmax:           121.00 cm/s PV Peak grad:  2.7 mmHg AV Vmean:          84.800 cm/s PV Mean grad:  2.0 mmHg AV VTI:            0.262 m AV Peak Grad:      5.9 mmHg AV Mean Grad:      3.0 mmHg LVOT Vmax:  81.60 cm/s LVOT Vmean:        62.500 cm/s LVOT VTI:          0.185 m LVOT/AV VTI ratio: 0.71  AORTA Ao Root diam: 3.10 cm Ao Asc diam:  3.10 cm MITRAL VALVE               TRICUSPID VALVE MV Area (PHT): 3.48 cm    TR Peak grad:   17.5 mmHg MV Decel Time: 218 msec    TR Vmax:        209.00 cm/s MV E velocity: 75.40 cm/s MV A velocity: 41.10 cm/s  SHUNTS MV E/A ratio:  1.83        Systemic VTI:  0.18 m                            Systemic Diam: 2.50 cm Buford Dresser MD Electronically signed by Buford Dresser MD Signature Date/Time: 09/15/2020/12:46:41 PM    Final     Cardiac Studies   Transthoracic Echocardiogram: Date: 09/15/20 Results: 1. Left  ventricular ejection fraction, by estimation, is 55 to 60%. The  left ventricle has normal function. The left ventricle has no regional  wall motion abnormalities. Left ventricular diastolic parameters were  normal.  2. Right ventricular systolic function is normal. The right ventricular  size is normal. There is normal pulmonary artery systolic pressure.  3. The mitral valve is normal in structure. No evidence of mitral valve  regurgitation. No evidence of mitral stenosis.  4. The aortic valve is tricuspid. Aortic valve regurgitation is not  visualized. No aortic stenosis is present.  5. The inferior vena cava is normal in size with greater than 50%  respiratory variability, suggesting right atrial pressure of 3 mmHg.  Patient Profile     25 y.o. male with history of HIV and asthma presenting with COVID-19 and non-cardiac chest pain  Assessment & Plan    Chest Pain COVID-19 HIV- independent risk factor for early CAD; plus on HAART Asthma - no evidence of PE - No coronary calcium and novel troponins WNL X3 - normal ESR and CRP; no WMA or pericardial effusion consistent with myo/pericardtiis - discussed with patient that if this is COVID-19 related we do not know how long it might take for this to completely resolve   [facility] will sign off.   Medication Recommendations:  NA Other recommendations (labs, testing, etc):  NA Follow up as an outpatient:  PRN  For questions or updates, please contact [facility] Please consult [email or web address] for contact info under Cardiology/STEMI.      Signed, Werner Lean, MD  09/15/2020, 12:50 PM

## 2020-09-15 NOTE — Discharge Instructions (Signed)
Person Under Monitoring Name: Kenneth Frank  Location: [address] [place] [address]   Infection Prevention Recommendations for Individuals Confirmed to have, or Being Evaluated for, 2019 Novel Coronavirus (COVID-19) Infection Who Receive Care at Home  Individuals who are confirmed to have, or are being evaluated for, COVID-19 should follow the prevention steps below until a healthcare provider or local or state health department says they can return to normal activities.  Stay home except to get medical care You should restrict activities outside your home, except for getting medical care. Do not go to work, school, or public areas, and do not use public transportation or taxis.  Call ahead before visiting your doctor Before your medical appointment, call the healthcare provider and tell them that you have, or are being evaluated for, COVID-19 infection. This will help the healthcare provider's office take steps to keep other people from getting infected. Ask your healthcare provider to call the local or state health department.  Monitor your symptoms Seek prompt medical attention if your illness is worsening (e.g., difficulty breathing). Before going to your medical appointment, call the healthcare provider and tell them that you have, or are being evaluated for, COVID-19 infection. Ask your healthcare provider to call the local or state health department.  Wear a facemask You should wear a facemask that covers your nose and mouth when you are in the same room with other people and when you visit a healthcare provider. People who live with or visit you should also wear a facemask while they are in the same room with you.  Separate yourself from other people in your home As much as possible, you should stay in a different room from other people in your home. Also, you should use a separate bathroom, if available.  Avoid sharing household items You  should not share dishes, drinking glasses, cups, eating utensils, towels, bedding, or other items with other people in your home. After using these items, you should wash them thoroughly with soap and water.  Cover your coughs and sneezes Cover your mouth and nose with a tissue when you cough or sneeze, or you can cough or sneeze into your sleeve. Throw used tissues in a lined trash can, and immediately wash your hands with soap and water for at least 20 seconds or use an alcohol-based hand rub.  Wash your [employer] your hands often and thoroughly with soap and water for at least 20 seconds. You can use an alcohol-based hand sanitizer if soap and water are not available and if your hands are not visibly dirty. Avoid touching your eyes, nose, and mouth with unwashed hands.   Prevention Steps for Caregivers and Household Members of Individuals Confirmed to have, or Being Evaluated for, COVID-19 Infection Being Cared for in the Home  If you live with, or provide care at home for, a person confirmed to have, or being evaluated for, COVID-19 infection please follow these guidelines to prevent infection:  Follow healthcare provider's instructions Make sure that you understand and can help the patient follow any healthcare provider instructions for all care.  Provide for the patient's basic needs You should help the patient with basic needs in the home and provide support for getting groceries, prescriptions, and other personal needs.  Monitor the patient's symptoms If they are getting sicker, call his or her medical provider and tell them that the patient has, or is being evaluated for, COVID-19 infection. This will help the healthcare  provider's office take steps to keep other people from getting infected. Ask the healthcare provider to call the local or state health department.  Limit the number of people who have contact with the patient  If possible, have only one caregiver for the  patient.  Other household members should stay in another home or place of residence. If this is not possible, they should stay  in another room, or be separated from the patient as much as possible. Use a separate bathroom, if available.  Restrict visitors who do not have an essential need to be in the home.  Keep older adults, very young children, and other sick people away from the patient Keep older adults, very young children, and those who have compromised immune systems or chronic health conditions away from the patient. This includes people with chronic heart, lung, or kidney conditions, diabetes, and cancer.  Ensure good ventilation Make sure that shared spaces in the home have good air flow, such as from an air conditioner or an opened window, weather permitting.  Wash your hands often  Wash your hands often and thoroughly with soap and water for at least 20 seconds. You can use an alcohol based hand sanitizer if soap and water are not available and if your hands are not visibly dirty.  Avoid touching your eyes, nose, and mouth with unwashed hands.  Use disposable paper towels to dry your hands. If not available, use dedicated cloth towels and replace them when they become wet.  Wear a facemask and gloves  Wear a disposable facemask at all times in the room and gloves when you touch or have contact with the patient's blood, body fluids, and/or secretions or excretions, such as sweat, saliva, sputum, nasal mucus, vomit, urine, or feces.  Ensure the mask fits over your nose and mouth tightly, and do not touch it during use.  Throw out disposable facemasks and gloves after using them. Do not reuse.  Wash your hands immediately after removing your facemask and gloves.  If your personal clothing becomes contaminated, carefully remove clothing and launder. Wash your hands after handling contaminated clothing.  Place all used disposable facemasks, gloves, and other waste in a lined  container before disposing them with other household waste.  Remove gloves and wash your hands immediately after handling these items.  Do not share dishes, glasses, or other household items with the patient  Avoid sharing household items. You should not share dishes, drinking glasses, cups, eating utensils, towels, bedding, or other items with a patient who is confirmed to have, or being evaluated for, COVID-19 infection.  After the person uses these items, you should wash them thoroughly with soap and water.  Wash laundry thoroughly  Immediately remove and wash clothes or bedding that have blood, body fluids, and/or secretions or excretions, such as sweat, saliva, sputum, nasal mucus, vomit, urine, or feces, on them.  Wear gloves when handling laundry from the patient.  Read and follow directions on labels of laundry or clothing items and detergent. In general, wash and dry with the warmest temperatures recommended on the label.  Clean all areas the individual has used often  Clean all touchable surfaces, such as counters, tabletops, doorknobs, bathroom fixtures, toilets, phones, keyboards, tablets, and bedside tables, every day. Also, clean any surfaces that may have blood, body fluids, and/or secretions or excretions on them.  Wear gloves when cleaning surfaces the patient has come in contact with.  Use a diluted bleach solution (e.g., dilute bleach with  1 part bleach and 10 parts water) or a household disinfectant with a label that says EPA-registered for coronaviruses. To make a bleach solution at home, add 1 tablespoon of bleach to 1 quart (4 cups) of water. For a larger supply, add  cup of bleach to 1 gallon (16 cups) of water.  Read labels of cleaning products and follow recommendations provided on product labels. Labels contain instructions for safe and effective use of the cleaning product including precautions you should take when applying the product, such as wearing gloves or  eye protection and making sure you have good ventilation during use of the product.  Remove gloves and wash hands immediately after cleaning.  Monitor yourself for signs and symptoms of illness Caregivers and household members are considered close contacts, should monitor their health, and will be asked to limit movement outside of the home to the extent possible. Follow the monitoring steps for close contacts listed on the symptom monitoring form.   ? If you have additional questions, contact your local health department or call the epidemiologist on call at [phone] (available 24/7). ? This guidance is subject to change. For the most up-to-date guidance from [facility], please refer to their website: [email or web address]

## 2020-09-15 NOTE — Progress Notes (Signed)
Echocardiogram completed.

## 2020-09-15 NOTE — Discharge Summary (Signed)
PATIENT DETAILS Name: Kenneth Frank Ninh Age: 25 y.o. Sex: male Date of Birth: December 08, 1995 MRN: [id number]. Admitting Physician: Delano Metzobert Schertz, MD WUJ:[id number]:Patient, No Pcp Per (Inactive)  Admit Date: 09/14/2020 Discharge date: 09/15/2020  Recommendations for Outpatient Follow-up:  1. Follow up with PCP in 1-2 weeks 2. Please obtain CMP/CBC in one week   Admitted From:  Home  Disposition: Home   Home Health: No  Equipment/Devices: None  Discharge Condition: Stable  CODE STATUS: FULL CODE  Diet recommendation:  Diet Order            Diet - low sodium heart healthy           Diet regular Room service appropriate? Yes; Fluid consistency: Thin  Diet effective now                 Brief Narrative: Patient is a 25 y.o. male with PMHx of HIV-recently diagnosed with COVID-19 infection on 4/12 (headache/myalgias)-presenting with chest pain.  Admitted for further evaluation and treatment.  COVID-19 vaccinated status: Vaccinated but not boosted.  Significant Events: [facility]14>> [facility]  Significant studies: 4/14>> CTA chest: No PE-diffuse bilateral bronchial wall thickening.  COVID-19 medications: Remdesivir: 4/14>> 4/15  Antibiotics: None  Microbiology data: None  Procedures: None  Consults: Cardiology   Brief Hospital Course: Chest pain: Initially there was some concern for pericarditis-however inflammatory markers, echocardiogram was unremarkable.  CTA chest was negative.  Now thought to have noncardiac cause of chest pain-probably musculoskeletal in etiology from coughing spells.  No further recommendations from cardiology-okay for discharge.    COVID-19 infection: Vaccinated but not boosted-no pneumonia on imaging-on room air-very comfortable-briefly on Remdesivir-but suspect stable for discharge given lack of any major findings on imaging and clinical stability. HIV: Continue antiretrovirals-[facility].  Compliant with medications.  AKI: Mild-probably hemodynamically mediated-could have some mild element of contrast-induced nephropathy as well-stable for repeat chemistry panel in 1 week at PCPs office.  Discharge Diagnoses:  Active Problems:   COVID-19 virus infection   Chest pain at rest   Abnormal EKG   History of asthma   Chest pain   Discharge Instructions:    Person Under Monitoring Name: Kenneth Frank Jewkes  Location: [address] [place] [id number]   Infection Prevention Recommendations for Individuals Confirmed to have, or Being Evaluated for, 2019 Novel Coronavirus (COVID-19) Infection Who Receive Care at Home  Individuals who are confirmed to have, or are being evaluated for, COVID-19 should follow the prevention steps below until a healthcare provider or local or state health department says they can return to normal activities.  Stay home except to get medical care You should restrict activities outside your home, except for getting medical care. Do not go to work, school, or public areas, and do not use public transportation or taxis.  Call ahead before visiting your doctor Before your medical appointment, call the healthcare provider and tell them that you have, or are being evaluated for, COVID-19 infection. This will help the healthcare provider's office take steps to keep other people from getting infected. Ask your healthcare provider to call the local or state health department.  Monitor your symptoms Seek prompt medical attention if your illness is worsening (e.g., difficulty breathing). Before going to your medical appointment, call the healthcare provider and tell them that you have, or are being evaluated for, COVID-19 infection. Ask your healthcare provider to call the local or state health department.  Wear  a facemask You should wear a facemask that covers your nose and mouth when you are in the same room with other people  and when you visit a healthcare provider. People who live with or visit you should also wear a facemask while they are in the same room with you.  Separate yourself from other people in your home As much as possible, you should stay in a different room from other people in your home. Also, you should use a separate bathroom, if available.  Avoid sharing household items You should not share dishes, drinking glasses, cups, eating utensils, towels, bedding, or other items with other people in your home. After using these items, you should wash them thoroughly with soap and water.  Cover your coughs and sneezes Cover your mouth and nose with a tissue when you cough or sneeze, or you can cough or sneeze into your sleeve. Throw used tissues in a lined trash can, and immediately wash your hands with soap and water for at least 20 seconds or use an alcohol-based hand rub.  Wash your [employer] your hands often and thoroughly with soap and water for at least 20 seconds. You can use an alcohol-based hand sanitizer if soap and water are not available and if your hands are not visibly dirty. Avoid touching your eyes, nose, and mouth with unwashed hands.   Prevention Steps for Caregivers and Household Members of Individuals Confirmed to have, or Being Evaluated for, COVID-19 Infection Being Cared for in the Home  If you live with, or provide care at home for, a person confirmed to have, or being evaluated for, COVID-19 infection please follow these guidelines to prevent infection:  Follow healthcare provider's instructions Make sure that you understand and can help the patient follow any healthcare provider instructions for all care.  Provide for the patient's basic needs You should help the patient with basic needs in the home and provide support for getting groceries, prescriptions, and other personal needs.  Monitor the patient's symptoms If they are getting sicker, call his or her medical  provider and tell them that the patient has, or is being evaluated for, COVID-19 infection. This will help the healthcare provider's office take steps to keep other people from getting infected. Ask the healthcare provider to call the local or state health department.  Limit the number of people who have contact with the patient  If possible, have only one caregiver for the patient.  Other household members should stay in another home or place of residence. If this is not possible, they should stay  in another room, or be separated from the patient as much as possible. Use a separate bathroom, if available.  Restrict visitors who do not have an essential need to be in the home.  Keep older adults, very young children, and other sick people away from the patient Keep older adults, very young children, and those who have compromised immune systems or chronic health conditions away from the patient. This includes people with chronic heart, lung, or kidney conditions, diabetes, and cancer.  Ensure good ventilation Make sure that shared spaces in the home have good air flow, such as from an air conditioner or an opened window, weather permitting.  Wash your hands often  Wash your hands often and thoroughly with soap and water for at least 20 seconds. You can use an alcohol based hand sanitizer if soap and water are not available and if your hands are not visibly dirty.  Avoid  touching your eyes, nose, and mouth with unwashed hands.  Use disposable paper towels to dry your hands. If not available, use dedicated cloth towels and replace them when they become wet.  Wear a facemask and gloves  Wear a disposable facemask at all times in the room and gloves when you touch or have contact with the patient's blood, body fluids, and/or secretions or excretions, such as sweat, saliva, sputum, nasal mucus, vomit, urine, or feces.  Ensure the mask fits over your nose and mouth tightly, and do not touch  it during use.  Throw out disposable facemasks and gloves after using them. Do not reuse.  Wash your hands immediately after removing your facemask and gloves.  If your personal clothing becomes contaminated, carefully remove clothing and launder. Wash your hands after handling contaminated clothing.  Place all used disposable facemasks, gloves, and other waste in a lined container before disposing them with other household waste.  Remove gloves and wash your hands immediately after handling these items.  Do not share dishes, glasses, or other household items with the patient  Avoid sharing household items. You should not share dishes, drinking glasses, cups, eating utensils, towels, bedding, or other items with a patient who is confirmed to have, or being evaluated for, COVID-19 infection.  After the person uses these items, you should wash them thoroughly with soap and water.  Wash laundry thoroughly  Immediately remove and wash clothes or bedding that have blood, body fluids, and/or secretions or excretions, such as sweat, saliva, sputum, nasal mucus, vomit, urine, or feces, on them.  Wear gloves when handling laundry from the patient.  Read and follow directions on labels of laundry or clothing items and detergent. In general, wash and dry with the warmest temperatures recommended on the label.  Clean all areas the individual has used often  Clean all touchable surfaces, such as counters, tabletops, doorknobs, bathroom fixtures, toilets, phones, keyboards, tablets, and bedside tables, every day. Also, clean any surfaces that may have blood, body fluids, and/or secretions or excretions on them.  Wear gloves when cleaning surfaces the patient has come in contact with.  Use a diluted bleach solution (e.g., dilute bleach with 1 part bleach and 10 parts water) or a household disinfectant with a label that says EPA-registered for coronaviruses. To make a bleach solution at home, add 1  tablespoon of bleach to 1 quart (4 cups) of water. For a larger supply, add  cup of bleach to 1 gallon (16 cups) of water.  Read labels of cleaning products and follow recommendations provided on product labels. Labels contain instructions for safe and effective use of the cleaning product including precautions you should take when applying the product, such as wearing gloves or eye protection and making sure you have good ventilation during use of the product.  Remove gloves and wash hands immediately after cleaning.  Monitor yourself for signs and symptoms of illness Caregivers and household members are considered close contacts, should monitor their health, and will be asked to limit movement outside of the home to the extent possible. Follow the monitoring steps for close contacts listed on the symptom monitoring form.   ? If you have additional questions, contact your local health department or call the epidemiologist on call at [phone] (available 24/7). ? This guidance is subject to change. For the most up-to-date guidance from [facility], please refer to their website: [email or web address]hu    Activity:  As tolerated   Discharge Instructions    Call MD  for:  difficulty breathing, headache or visual disturbances   Complete by: As directed    Diet - low sodium heart healthy   Complete by: As directed    Discharge instructions   Complete by: As directed    Follow with Primary MD 1 week  Please ask your primary care practitioner to repeat a chemistry panel within 1 week-to reassess your renal function.  If you develop shortness of breath-please seek immediate medical attention.  7 days of isolation for your COVID-19 infection.  Please get a complete blood count and chemistry panel checked by your Primary MD at your next visit, and again as instructed by your Primary MD.  Get Medicines reviewed and adjusted: Please take all  your medications with you for your next visit with your Primary MD  Laboratory/radiological data: Please request your Primary MD to go over all hospital tests and procedure/radiological results at the follow up, please ask your Primary MD to get all Hospital records sent to his/her office.  In some cases, they will be blood work, cultures and biopsy results pending at the time of your discharge. Please request that your primary care M.D. follows up on these results.  Also Note the following: If you experience worsening of your admission symptoms, develop shortness of breath, life threatening emergency, suicidal or homicidal thoughts you must seek medical attention immediately by calling 911 or calling your MD immediately  if symptoms less severe.  You must read complete instructions/literature along with all the possible adverse reactions/side effects for all the Medicines you take and that have been prescribed to you. Take any new Medicines after you have completely understood and accpet all the possible adverse reactions/side effects.   Do not drive when taking Pain medications or sleeping medications (Benzodaizepines)  Do not take more than prescribed Pain, Sleep and Anxiety Medications. It is not advisable to combine anxiety,sleep and pain medications without talking with your primary care practitioner  Special Instructions: If you have smoked or chewed Tobacco  in the last 2 yrs please stop smoking, stop any regular Alcohol  and or any Recreational drug use.  Wear Seat belts while driving.  Please note: You were cared for by a hospitalist during your hospital stay. Once you are discharged, your primary care physician will handle any further medical issues. Please note that NO REFILLS for any discharge medications will be authorized once you are discharged, as it is imperative that you return to your primary care physician (or establish a relationship with a primary care physician if you do  not have one) for your post hospital discharge needs so that they can reassess your need for medications and monitor your lab values.   Increase activity slowly   Complete by: As directed      Allergies as of 09/15/2020      Reactions   Other Other (See Comments)   Pollen extract - eyes starts swollen      Medication List    STOP taking these medications   fluticasone 50 MCG/ACT nasal spray Commonly known as: FLONASE   HYDROcodone-homatropine 5-1.5 MG/5ML syrup Commonly known as: HYCODAN   montelukast 5 MG chewable tablet Commonly known as: SINGULAIR   pseudoephedrine 120 MG 12 hr tablet Commonly known as: Sudafed 12 Hour     TAKE these medications   acetaminophen 325 MG tablet Commonly known as: TYLENOL Take 2 tablets (650 mg total) by mouth every 6 (six) hours as needed for mild pain (or Fever >/= 101).  albuterol 108 (90 Base) MCG/ACT inhaler Commonly known as: VENTOLIN HFA Inhale 1-2 puffs into the lungs every 6 (six) hours as needed for wheezing or shortness of breath.   benzonatate 100 MG capsule Commonly known as: TESSALON Take 1-2 capsules (100-200 mg total) by mouth 3 (three) times daily as needed for cough.   Biktarvy 50-200-25 MG Tabs tablet Generic drug: bictegravir-emtricitabine-tenofovir AF Take 1 tablet by mouth daily.   cetirizine 10 MG tablet Commonly known as: ZyrTEC Allergy Take 1 tablet (10 mg total) by mouth daily.       Follow-up Information    Primary care MD. Schedule an appointment as soon as possible for a visit in 1 week(s).   Why: For post hospital discharge visit and to repeat chemistries.             Allergies  Allergen Reactions  . Other Other (See Comments)    Pollen extract - eyes starts swollen       Other Procedures/Studies: CT Angio Chest PE W and/or Wo Contrast  Result Date: 09/14/2020 CLINICAL DATA:  PE suspected, recent diagnosis of COVID, chest pain EXAM: CT ANGIOGRAPHY CHEST WITH CONTRAST TECHNIQUE:  Multidetector CT imaging of the chest was performed using the standard protocol during bolus administration of intravenous contrast. Multiplanar CT image reconstructions and MIPs were obtained to evaluate the vascular anatomy. CONTRAST:  OMNIPAQUE IOHEXOL 350 MG/ML SOLN COMPARISON:  None. FINDINGS: Cardiovascular: Satisfactory opacification of the pulmonary arteries to the segmental level. No evidence of pulmonary embolism. Normal heart size. No pericardial effusion. Mediastinum/Nodes: No enlarged mediastinal, hilar, or axillary lymph nodes. Thyroid gland, trachea, and esophagus demonstrate no significant findings. Lungs/Pleura: Diffuse bilateral bronchial wall thickening. Trace bilateral pleural effusions. Upper Abdomen: No acute abnormality. Musculoskeletal: No chest wall abnormality. No acute or significant osseous findings. Review of the MIP images confirms the above findings. IMPRESSION: 1. Negative examination for pulmonary embolism. 2. Diffuse bilateral bronchial wall thickening, consistent with nonspecific infectious or inflammatory bronchitis. 3. Trace bilateral pleural effusions. 4. No acute airspace disease per specifically reported history of COVID. Electronically Signed   By: Lauralyn Primes M.D.   On: 09/14/2020 19:25   DG Chest Port 1 View  Result Date: 09/14/2020 CLINICAL DATA:  Chest pain COVID 19 positivity with shortness of breath EXAM: PORTABLE CHEST 1 VIEW COMPARISON:  04/05/2015 FINDINGS: The heart size and mediastinal contours are within normal limits. Both lungs are clear. The visualized skeletal structures are unremarkable. IMPRESSION: No active disease. Electronically Signed   By: Alcide Clever M.D.   On: 09/14/2020 09:52   ECHOCARDIOGRAM COMPLETE  Result Date: 09/15/2020    ECHOCARDIOGRAM REPORT   Patient Name:   MARCELLIUS MONTAGNA Date of Exam: 09/15/2020 Medical Rec #:  [id number]  Height:       67.0 in Accession #:    [id number] Weight:       185.0 lb Date of Birth:  August 05, 1995  BSA:           1.956 m Patient Age:    25 years   BP:           121/82 mmHg Patient Gender: M          HR:           50 bpm. Exam Location:  Inpatient Procedure: 2D Echo, Cardiac Doppler and Color Doppler Indications:    Chest pain  History:        Patient has no prior history of Echocardiogram examinations.  Sonographer:  Neomia Dear RDCS Referring Phys: [id number]  Sonographer Comments: No cardiac procedures or surgeries were indicated on the chart. IMPRESSIONS  1. Left ventricular ejection fraction, by estimation, is 55 to 60%. The left ventricle has normal function. The left ventricle has no regional wall motion abnormalities. Left ventricular diastolic parameters were normal.  2. Right ventricular systolic function is normal. The right ventricular size is normal. There is normal pulmonary artery systolic pressure.  3. The mitral valve is normal in structure. No evidence of mitral valve regurgitation. No evidence of mitral stenosis.  4. The aortic valve is tricuspid. Aortic valve regurgitation is not visualized. No aortic stenosis is present.  5. The inferior vena cava is normal in size with greater than 50% respiratory variability, suggesting right atrial pressure of 3 mmHg. Comparison(s): No prior Echocardiogram. Conclusion(s)/Recommendation(s): Normal biventricular function without evidence of hemodynamically significant valvular heart disease. FINDINGS  Left Ventricle: Left ventricular ejection fraction, by estimation, is 55 to 60%. The left ventricle has normal function. The left ventricle has no regional wall motion abnormalities. The left ventricular internal cavity size was normal in size. There is  no left ventricular hypertrophy. Left ventricular diastolic parameters were normal. Right Ventricle: The right ventricular size is normal. No increase in right ventricular wall thickness. Right ventricular systolic function is normal. There is normal pulmonary artery systolic pressure. The tricuspid  regurgitant velocity is 2.09 m/s, and  with an assumed right atrial pressure of 3 mmHg, the estimated right ventricular systolic pressure is 20.5 mmHg. Left Atrium: Left atrial size was normal in size. Right Atrium: Right atrial size was normal in size. Pericardium: There is no evidence of pericardial effusion. Mitral Valve: The mitral valve is normal in structure. No evidence of mitral valve regurgitation. No evidence of mitral valve stenosis. Tricuspid Valve: The tricuspid valve is normal in structure. Tricuspid valve regurgitation is trivial. Aortic Valve: The aortic valve is tricuspid. Aortic valve regurgitation is not visualized. No aortic stenosis is present. Aortic valve mean gradient measures 3.0 mmHg. Aortic valve peak gradient measures 5.9 mmHg. Aortic valve area, by VTI measures 3.47 cm. Pulmonic Valve: The pulmonic valve was grossly normal. Pulmonic valve regurgitation is mild to moderate. No evidence of pulmonic stenosis. Aorta: The aortic root, ascending aorta and aortic arch are all structurally normal, with no evidence of dilitation or obstruction. Venous: The inferior vena cava is normal in size with greater than 50% respiratory variability, suggesting right atrial pressure of 3 mmHg. IAS/Shunts: No atrial level shunt detected by color flow Doppler.  LEFT VENTRICLE PLAX 2D LVIDd:         4.90 cm     Diastology LVIDs:         3.50 cm     LV e' medial:    9.03 cm/s LV PW:         1.10 cm     LV E/e' medial:  8.3 LV IVS:        1.00 cm     LV e' lateral:   10.20 cm/s LVOT diam:     2.50 cm     LV E/e' lateral: 7.4 LV SV:         91 LV SV Index:   46 LVOT Area:     4.91 cm  LV Volumes (MOD) LV vol d, MOD A2C: 86.4 ml LV vol d, MOD A4C: 65.0 ml LV vol s, MOD A2C: 39.8 ml LV vol s, MOD A4C: 34.4 ml LV SV MOD A2C:  46.6 ml LV SV MOD A4C:     65.0 ml LV SV MOD BP:      39.3 ml RIGHT VENTRICLE RV S prime:     10.30 cm/s TAPSE (M-mode): 2.2 cm LEFT ATRIUM             Index       RIGHT ATRIUM            Index LA Vol (A2C):   28.1 ml 14.36 ml/m RA Area:     16.00 cm LA Vol (A4C):   33.1 ml 16.92 ml/m RA Volume:   38.90 ml  19.88 ml/m LA Biplane Vol: 33.9 ml 17.33 ml/m  AORTIC VALVE                   PULMONIC VALVE AV Area (Vmax):    3.31 cm    PV Vmax:       0.83 m/s AV Area (Vmean):   3.62 cm    PV Vmean:      65.700 cm/s AV Area (VTI):     3.47 cm    PV VTI:        0.214 m AV Vmax:           121.00 cm/s PV Peak grad:  2.7 mmHg AV Vmean:          84.800 cm/s PV Mean grad:  2.0 mmHg AV VTI:            0.262 m AV Peak Grad:      5.9 mmHg AV Mean Grad:      3.0 mmHg LVOT Vmax:         81.60 cm/s LVOT Vmean:        62.500 cm/s LVOT VTI:          0.185 m LVOT/AV VTI ratio: 0.71  AORTA Ao Root diam: 3.10 cm Ao Asc diam:  3.10 cm MITRAL VALVE               TRICUSPID VALVE MV Area (PHT): 3.48 cm    TR Peak grad:   17.5 mmHg MV Decel Time: 218 msec    TR Vmax:        209.00 cm/s MV E velocity: 75.40 cm/s MV A velocity: 41.10 cm/s  SHUNTS MV E/A ratio:  1.83        Systemic VTI:  0.18 m                            Systemic Diam: 2.50 cm Jodelle Red MD Electronically signed by Jodelle Red MD Signature Date/Time: 09/15/2020/12:46:41 PM    Final      TODAY-DAY OF DISCHARGE:  Subjective:   Kenneth Grumbling today has no headache,no chest abdominal pain,no new weakness tingling or numbness, feels much better wants to go home today.   Objective:   Blood pressure 118/79, pulse (!) 52, temperature 98.6 F (37 C), temperature source Oral, resp. rate 14, height  (1.702 m), weight 83.9 kg, SpO2 100 %.  Intake/Output Summary (Last 24 hours) at 09/15/2020 1400 Last data filed at 09/15/2020 0930 Gross per 24 hour  Intake 502.61 ml  Output 300 ml  Net 202.61 ml   Filed Weights   09/14/20 0903 09/15/20 0211  Weight: 81.6 kg 83.9 kg    Exam: Awake Alert, Oriented *3, No new F.N deficits, Normal affect [place].AT,PERRAL Supple Neck,No JVD, No cervical lymphadenopathy appriciated.  Symmetrical  Chest wall movement, Good  air movement bilaterally, CTAB RRR,No Gallops,Rubs or new Murmurs, No Parasternal Heave +ve B.Sounds, Abd Soft, Non tender, No organomegaly appriciated, No rebound -guarding or rigidity. No Cyanosis, Clubbing or edema, No new Rash or bruise   PERTINENT RADIOLOGIC STUDIES: CT Angio Chest PE W and/or Wo Contrast  Result Date: 09/14/2020 CLINICAL DATA:  PE suspected, recent diagnosis of COVID, chest pain EXAM: CT ANGIOGRAPHY CHEST WITH CONTRAST TECHNIQUE: Multidetector CT imaging of the chest was performed using the standard protocol during bolus administration of intravenous contrast. Multiplanar CT image reconstructions and MIPs were obtained to evaluate the vascular anatomy. CONTRAST:  OMNIPAQUE IOHEXOL 350 MG/ML SOLN COMPARISON:  None. FINDINGS: Cardiovascular: Satisfactory opacification of the pulmonary arteries to the segmental level. No evidence of pulmonary embolism. Normal heart size. No pericardial effusion. Mediastinum/Nodes: No enlarged mediastinal, hilar, or axillary lymph nodes. Thyroid gland, trachea, and esophagus demonstrate no significant findings. Lungs/Pleura: Diffuse bilateral bronchial wall thickening. Trace bilateral pleural effusions. Upper Abdomen: No acute abnormality. Musculoskeletal: No chest wall abnormality. No acute or significant osseous findings. Review of the MIP images confirms the above findings. IMPRESSION: 1. Negative examination for pulmonary embolism. 2. Diffuse bilateral bronchial wall thickening, consistent with nonspecific infectious or inflammatory bronchitis. 3. Trace bilateral pleural effusions. 4. No acute airspace disease per specifically reported history of COVID. Electronically Signed   By: Lauralyn Primes M.D.   On: 09/14/2020 19:25   DG Chest Port 1 View  Result Date: 09/14/2020 CLINICAL DATA:  Chest pain COVID 19 positivity with shortness of breath EXAM: PORTABLE CHEST 1 VIEW COMPARISON:  04/05/2015 FINDINGS: The heart size  and mediastinal contours are within normal limits. Both lungs are clear. The visualized skeletal structures are unremarkable. IMPRESSION: No active disease. Electronically Signed   By: Alcide Clever M.D.   On: 09/14/2020 09:52   ECHOCARDIOGRAM COMPLETE  Result Date: 09/15/2020    ECHOCARDIOGRAM REPORT   Patient Name:   Kenneth Frank Date of Exam: 09/15/2020 Medical Rec #:  [id number]  Height:       67.0 in Accession #:    [id number] Weight:       185.0 lb Date of Birth:  [birth year]  BSA:          1.956 m Patient Age:    25 years   BP:           121/82 mmHg Patient Gender: M          HR:           50 bpm. Exam Location:  Inpatient Procedure: 2D Echo, Cardiac Doppler and Color Doppler Indications:    Chest pain  History:        Patient has no prior history of Echocardiogram examinations.  Sonographer:    Neomia Dear RDCS Referring Phys: [id number]  Sonographer Comments: No cardiac procedures or surgeries were indicated on the chart. IMPRESSIONS  1. Left ventricular ejection fraction, by estimation, is 55 to 60%. The left ventricle has normal function. The left ventricle has no regional wall motion abnormalities. Left ventricular diastolic parameters were normal.  2. Right ventricular systolic function is normal. The right ventricular size is normal. There is normal pulmonary artery systolic pressure.  3. The mitral valve is normal in structure. No evidence of mitral valve regurgitation. No evidence of mitral stenosis.  4. The aortic valve is tricuspid. Aortic valve regurgitation is not visualized. No aortic stenosis is present.  5. The inferior vena cava is normal in size with greater than 50% respiratory variability,  suggesting right atrial pressure of 3 mmHg. Comparison(s): No prior Echocardiogram. Conclusion(s)/Recommendation(s): Normal biventricular function without evidence of hemodynamically significant valvular heart disease. FINDINGS  Left Ventricle: Left ventricular ejection fraction, by estimation,  is 55 to 60%. The left ventricle has normal function. The left ventricle has no regional wall motion abnormalities. The left ventricular internal cavity size was normal in size. There is  no left ventricular hypertrophy. Left ventricular diastolic parameters were normal. Right Ventricle: The right ventricular size is normal. No increase in right ventricular wall thickness. Right ventricular systolic function is normal. There is normal pulmonary artery systolic pressure. The tricuspid regurgitant velocity is 2.09 m/s, and  with an assumed right atrial pressure of 3 mmHg, the estimated right ventricular systolic pressure is 20.5 mmHg. Left Atrium: Left atrial size was normal in size. Right Atrium: Right atrial size was normal in size. Pericardium: There is no evidence of pericardial effusion. Mitral Valve: The mitral valve is normal in structure. No evidence of mitral valve regurgitation. No evidence of mitral valve stenosis. Tricuspid Valve: The tricuspid valve is normal in structure. Tricuspid valve regurgitation is trivial. Aortic Valve: The aortic valve is tricuspid. Aortic valve regurgitation is not visualized. No aortic stenosis is present. Aortic valve mean gradient measures 3.0 mmHg. Aortic valve peak gradient measures 5.9 mmHg. Aortic valve area, by VTI measures 3.47 cm. Pulmonic Valve: The pulmonic valve was grossly normal. Pulmonic valve regurgitation is mild to moderate. No evidence of pulmonic stenosis. Aorta: The aortic root, ascending aorta and aortic arch are all structurally normal, with no evidence of dilitation or obstruction. Venous: The inferior vena cava is normal in size with greater than 50% respiratory variability, suggesting right atrial pressure of 3 mmHg. IAS/Shunts: No atrial level shunt detected by color flow Doppler.  LEFT VENTRICLE PLAX 2D LVIDd:         4.90 cm     Diastology LVIDs:         3.50 cm     LV e' medial:    9.03 cm/s LV PW:         1.10 cm     LV E/e' medial:  8.3 LV IVS:         1.00 cm     LV e' lateral:   10.20 cm/s LVOT diam:     2.50 cm     LV E/e' lateral: 7.4 LV SV:         91 LV SV Index:   46 LVOT Area:     4.91 cm  LV Volumes (MOD) LV vol d, MOD A2C: 86.4 ml LV vol d, MOD A4C: 65.0 ml LV vol s, MOD A2C: 39.8 ml LV vol s, MOD A4C: 34.4 ml LV SV MOD A2C:     46.6 ml LV SV MOD A4C:     65.0 ml LV SV MOD BP:      39.3 ml RIGHT VENTRICLE RV S prime:     10.30 cm/s TAPSE (M-mode): 2.2 cm LEFT ATRIUM             Index       RIGHT ATRIUM           Index LA Vol (A2C):   28.1 ml 14.36 ml/m RA Area:     16.00 cm LA Vol (A4C):   33.1 ml 16.92 ml/m RA Volume:   38.90 ml  19.88 ml/m LA Biplane Vol: 33.9 ml 17.33 ml/m  AORTIC VALVE  PULMONIC VALVE AV Area (Vmax):    3.31 cm    PV Vmax:       0.83 m/s AV Area (Vmean):   3.62 cm    PV Vmean:      65.700 cm/s AV Area (VTI):     3.47 cm    PV VTI:        0.214 m AV Vmax:           121.00 cm/s PV Peak grad:  2.7 mmHg AV Vmean:          84.800 cm/s PV Mean grad:  2.0 mmHg AV VTI:            0.262 m AV Peak Grad:      5.9 mmHg AV Mean Grad:      3.0 mmHg LVOT Vmax:         81.60 cm/s LVOT Vmean:        62.500 cm/s LVOT VTI:          0.185 m LVOT/AV VTI ratio: 0.71  AORTA Ao Root diam: 3.10 cm Ao Asc diam:  3.10 cm MITRAL VALVE               TRICUSPID VALVE MV Area (PHT): 3.48 cm    TR Peak grad:   17.5 mmHg MV Decel Time: 218 msec    TR Vmax:        209.00 cm/s MV E velocity: 75.40 cm/s MV A velocity: 41.10 cm/s  SHUNTS MV E/A ratio:  1.83        Systemic VTI:  0.18 m                            Systemic Diam: 2.50 cm Jodelle Red MD Electronically signed by Jodelle Red MD Signature Date/Time: 09/15/2020/12:46:41 PM    Final      PERTINENT LAB RESULTS: CBC: Recent Labs    09/14/20 0943  WBC 5.1  HGB 14.5  HCT 43.4  PLT 207   CMET CMP     Component Value Date/Time   NA 137 09/15/2020 0341   K 3.5 09/15/2020 0341   CL 105 09/15/2020 0341   CO2 28 09/15/2020 0341   GLUCOSE 93  09/15/2020 0341   BUN 13 09/15/2020 0341   CREATININE 1.32 (H) 09/15/2020 0341   CALCIUM 8.4 (L) 09/15/2020 0341   PROT 8.2 (H) 09/26/2016 0121   ALBUMIN 4.0 09/26/2016 0121   AST 23 09/26/2016 0121   ALT 16 (L) 09/26/2016 0121   ALKPHOS 76 09/26/2016 0121   BILITOT 0.4 09/26/2016 0121   GFRNONAA >60 09/15/2020 0341   GFRAA >60 09/26/2016 0121    GFR Estimated Creatinine Clearance: 87.8 mL/min (A) (by C-G formula based on SCr of 1.32 mg/dL (H)). No results for input(s): LIPASE, AMYLASE in the last 72 hours. No results for input(s): CKTOTAL, CKMB, CKMBINDEX, TROPONINI in the last 72 hours. Invalid input(s): POCBNP Recent Labs    09/14/20 2018  DDIMER 0.36   No results for input(s): HGBA1C in the last 72 hours. No results for input(s): CHOL, HDL, LDLCALC, TRIG, CHOLHDL, LDLDIRECT in the last 72 hours. No results for input(s): TSH, T4TOTAL, T3FREE, THYROIDAB in the last 72 hours.  Invalid input(s): FREET3 No results for input(s): VITAMINB12, FOLATE, FERRITIN, TIBC, IRON, RETICCTPCT in the last 72 hours. Coags: No results for input(s): INR in the last 72 hours.  Invalid input(s): PT Microbiology: Recent Results (from the past 240 hour(s))  Resp Panel by RT-PCR (Flu A&B, Covid) Nasopharyngeal Swab     Status: Abnormal   Collection Time: 09/14/20  7:14 PM   Specimen: Nasopharyngeal Swab; Nasopharyngeal(NP) swabs in vial transport medium  Result Value Ref Range Status   SARS Coronavirus 2 by RT PCR POSITIVE (A) NEGATIVE Final    Comment: RESULT CALLED TO, READ BACK BY AND VERIFIED WITH: Truman Hayward RN 09/14/20 2144 JDW (NOTE) SARS-CoV-2 target nucleic acids are DETECTED.  The SARS-CoV-2 RNA is generally detectable in upper respiratory specimens during the acute phase of infection. Positive results are indicative of the presence of the identified virus, but do not rule out bacterial infection or co-infection with other pathogens not detected by the test. Clinical correlation  with patient history and other diagnostic information is necessary to determine patient infection status. The expected result is Negative.  Fact Sheet for Patients: BloggerCourse.com  Fact Sheet for Healthcare Providers: SeriousBroker.it  This test is not yet approved or cleared by the [place] FDA and  has been authorized for detection and/or diagnosis of SARS-CoV-2 by FDA under an Emergency Use Authorization (EUA).  This EUA will remain in effect (meaning this test can be used ) for the duration of  the COVID-19 declaration under Section 564(b)(1) of the Act, 21 U.S.C. section 360bbb-3(b)(1), unless the authorization is terminated or revoked sooner.     Influenza A by PCR NEGATIVE NEGATIVE Final   Influenza B by PCR NEGATIVE NEGATIVE Final    Comment: (NOTE) The Xpert Xpress SARS-CoV-2/FLU/RSV plus assay is intended as an aid in the diagnosis of influenza from Nasopharyngeal swab specimens and should not be used as a sole basis for treatment. Nasal washings and aspirates are unacceptable for Xpert Xpress SARS-CoV-2/FLU/RSV testing.  Fact Sheet for Patients: BloggerCourse.com  Fact Sheet for Healthcare Providers: SeriousBroker.it  This test is not yet approved or cleared by the [place] FDA and has been authorized for detection and/or diagnosis of SARS-CoV-2 by FDA under an Emergency Use Authorization (EUA). This EUA will remain in effect (meaning this test can be used) for the duration of the COVID-19 declaration under Section 564(b)(1) of the Act, 21 U.S.C. section 360bbb-3(b)(1), unless the authorization is terminated or revoked.  Performed at [facility], [address] [place], [place] [address]     FURTHER DISCHARGE INSTRUCTIONS:  Get Medicines reviewed and adjusted: Please take all your medications with you for your next visit with your Primary  MD  Laboratory/radiological data: Please request your Primary MD to go over all hospital tests and procedure/radiological results at the follow up, please ask your Primary MD to get all Hospital records sent to his/her office.  In some cases, they will be blood work, cultures and biopsy results pending at the time of your discharge. Please request that your primary care M.D. goes through all the records of your hospital data and follows up on these results.  Also Note the following: If you experience worsening of your admission symptoms, develop shortness of breath, life threatening emergency, suicidal or homicidal thoughts you must seek medical attention immediately by calling 911 or calling your MD immediately  if symptoms less severe.  You must read complete instructions/literature along with all the possible adverse reactions/side effects for all the Medicines you take and that have been prescribed to you. Take any new Medicines after you have completely understood and accpet all the possible adverse reactions/side effects.   Do not drive when taking Pain medications or sleeping medications (Benzodaizepines)  Do not take more than prescribed Pain, Sleep and Anxiety Medications. It is not advisable to combine anxiety,sleep and pain medications without talking with your primary care practitioner  Special Instructions: If you have smoked or chewed Tobacco  in the last 2 yrs please stop smoking, stop any regular Alcohol  and or any Recreational drug use.  Wear Seat belts while driving.  Please note: You were cared for by a hospitalist during your hospital stay. Once you are discharged, your primary care physician will handle any further medical issues. Please note that NO REFILLS for any discharge medications will be authorized once you are discharged, as it is imperative that you return to your primary care physician (or establish a relationship with a primary care physician if you do not have  one) for your post hospital discharge needs so that they can reassess your need for medications and monitor your lab values.  Total Time spent coordinating discharge including counseling, education and face to face time equals 25 minutes.  Signed: Forest Pruden 09/15/2020 2:00 PM

## 2020-09-15 NOTE — Progress Notes (Signed)
PROGRESS NOTE                                                                                                                                                                                                             Patient Demographics:    Kenneth Frank, is a 25 y.o. male, DOB - 11/23/1995, WUJ:[id number]  Outpatient Primary MD for the patient is Patient, No Pcp Per (Inactive)   Admit date - 09/14/2020   LOS - 0  Chief Complaint  Patient presents with  . Chest Pain       Brief Narrative: Patient is a 25 y.o. male with PMHx of HIV-recently diagnosed with COVID-19 infection on 4/12 (headache/myalgias)-presenting with chest pain.  Admitted for further evaluation and treatment.  COVID-19 vaccinated status: Vaccinated but not boosted.  Significant Events: 4/14>> [facility]  Significant studies: 4/14>> CTA chest: No PE-diffuse bilateral bronchial wall thickening.  COVID-19 medications: Remdesivir: 4/14>>  Antibiotics: None  Microbiology data: None  Procedures: None  Consults: Cardiology  DVT prophylaxis: enoxaparin (LOVENOX) injection 40 mg Start: 09/14/20 2200    Subjective:    Kenneth Frank today continues to have chest pain-but otherwise appears comfortable.   Assessment  & Plan :   Chest pain: Concern for pericarditis-awaiting echo.  CTA chest negative.  Await further recommendations from cardiology.  COVID-19 infection: Vaccinated but not boosted-not hypoxic-no pneumonia on imaging-on Remdesivir x3 days.   Recent Labs    09/14/20 1443 09/14/20 2018  DDIMER  --  0.36  LDH  --  142  CRP 0.5  --     Lab Results  Component Value Date   SARSCOV2NAA POSITIVE (A) 09/14/2020    HIV: Continue antiretrovirals  AKI: Mild-probably hemodynamically mediated-could have some mild element of contrast-induced nephropathy as well-unsure at this time-since mild-no further work-up  required-apart from repeat chemistries tomorrow morning.   ABG:    Component Value Date/Time   TCO2 31 01/05/2017 1910    Vent Settings: N/A    Condition - Stable  Family Communication  : None at bedside.  Code Status :  Full Code  Diet :  Diet Order            Diet regular Room service appropriate? Yes; Fluid consistency: Thin  Diet effective now  Disposition Plan  :   Status is: Observation  The patient will require care spanning > 2 midnights and should be moved to inpatient because: Inpatient level of care appropriate due to severity of illness  Dispo: The patient is from: Home              Anticipated d/c is to: Home              Patient currently is not medically stable to d/c.   Difficult to place patient No    Barriers to discharge: Ongoing chest pain-AKI-awaiting echo and further cardiology input.  Antimicorbials  :    Anti-infectives (From admission, onward)   Start     Dose/Rate Route Frequency Ordered Stop   09/15/20 1800  remdesivir 100 mg in sodium chloride 0.9 % 100 mL IVPB       "Followed by" Linked Group Details   100 mg 200 mL/hr over 30 Minutes Intravenous Daily 09/14/20 2258 09/17/20 0959   09/15/20 1039  bictegravir-emtricitabine-tenofovir AF (BIKTARVY) 50-200-25 MG per tablet 1 tablet        1 tablet Oral Daily at bedtime 09/15/20 1040     09/14/20 2345  remdesivir 200 mg in sodium chloride 0.9% 250 mL IVPB       "Followed by" Linked Group Details   200 mg 580 mL/hr over 30 Minutes Intravenous Once 09/14/20 2258 09/15/20 0112   09/14/20 2200  bictegravir-emtricitabine-tenofovir AF (BIKTARVY) 50-200-25 MG per tablet 1 tablet  Status:  Discontinued        1 tablet Oral Daily 09/14/20 2154 09/15/20 1040      Inpatient Medications  Scheduled Meds: . vitamin C  500 mg Oral Daily  . bictegravir-emtricitabine-tenofovir AF  1 tablet Oral QHS  . enoxaparin (LOVENOX) injection  40 mg Subcutaneous Q24H  . loratadine  10 mg  Oral Daily  . zinc sulfate  220 mg Oral Daily   Continuous Infusions: . sodium chloride 100 mL/hr at 09/15/20 0400  . remdesivir 100 mg in NS 100 mL     PRN Meds:.acetaminophen **OR** acetaminophen, albuterol, benzonatate, ondansetron **OR** ondansetron (ZOFRAN) IV, senna-docusate   Time Spent in minutes  25  See all Orders from today for further details   Jeoffrey Massed M.D on 09/15/2020 at 12:06 PM  To page go to [email or web address] - use universal password  [facility] -  Office  [phone]    Objective:   Vitals:   09/15/20 0211 09/15/20 0540 09/15/20 0756 09/15/20 1155  BP: 126/85 111/70 121/82 118/79  Pulse: 62 (!) 54 77 (!) 52  Resp: 16 12 13 14   Temp: 98.1 F (36.7 C) 98 F (36.7 C) 97.6 F (36.4 C) 98.6 F (37 C)  TempSrc: Oral Oral Axillary Oral  SpO2: 100% 98% 99% 100%  Weight: 83.9 kg     Height: 5\' 7"  (1.702 m)       Wt Readings from Last 3 Encounters:  09/15/20 83.9 kg  04/11/15 65.5 kg  04/08/15 70.9 kg     Intake/Output Summary (Last 24 hours) at 09/15/2020 1206 Last data filed at 09/15/2020 0930 Gross per 24 hour  Intake 502.61 ml  Output 300 ml  Net 202.61 ml     Physical Exam Gen Exam:Alert awake-not in any distress HEENT:atraumatic, normocephalic Chest: B/L clear to auscultation anteriorly CVS:S1S2 regular Abdomen:soft non tender, non distended Extremities:no edema Neurology: Non focal Skin: no rash   Data Review:    CBC Recent Labs  Lab 09/14/20 0943  WBC 5.1  HGB 14.5  HCT 43.4  PLT 207  MCV 89.7  MCH 30.0  MCHC 33.4  RDW 12.5    Chemistries  Recent Labs  Lab 09/14/20 0943 09/15/20 0341  NA 140 137  K 3.6 3.5  CL 103 105  CO2 28 28  GLUCOSE 96 93  BUN 12 13  CREATININE 1.31* 1.32*  CALCIUM 9.1 8.4*   ------------------------------------------------------------------------------------------------------------------ No results for input(s): CHOL, HDL, LDLCALC, TRIG, CHOLHDL, LDLDIRECT in the last 72  hours.  No results found for: HGBA1C ------------------------------------------------------------------------------------------------------------------ No results for input(s): TSH, T4TOTAL, T3FREE, THYROIDAB in the last 72 hours.  Invalid input(s): FREET3 ------------------------------------------------------------------------------------------------------------------ No results for input(s): VITAMINB12, FOLATE, FERRITIN, TIBC, IRON, RETICCTPCT in the last 72 hours.  Coagulation profile No results for input(s): INR, PROTIME in the last 168 hours.  Recent Labs    09/14/20 2018  DDIMER 0.36    Cardiac Enzymes No results for input(s): CKMB, TROPONINI, MYOGLOBIN in the last 168 hours.  Invalid input(s): CK ------------------------------------------------------------------------------------------------------------------ No results found for: BNP  Micro Results Recent Results (from the past 240 hour(s))  Resp Panel by RT-PCR (Flu A&B, Covid) Nasopharyngeal Swab     Status: Abnormal   Collection Time: 09/14/20  7:14 PM   Specimen: Nasopharyngeal Swab; Nasopharyngeal(NP) swabs in vial transport medium  Result Value Ref Range Status   SARS Coronavirus 2 by RT PCR POSITIVE (A) NEGATIVE Final    Comment: RESULT CALLED TO, READ BACK BY AND VERIFIED WITH: Truman Hayward RN 09/14/20 2144 JDW (NOTE) SARS-CoV-2 target nucleic acids are DETECTED.  The SARS-CoV-2 RNA is generally detectable in upper respiratory specimens during the acute phase of infection. Positive results are indicative of the presence of the identified virus, but do not rule out bacterial infection or co-infection with other pathogens not detected by the test. Clinical correlation with patient history and other diagnostic information is necessary to determine patient infection status. The expected result is Negative.  Fact Sheet for Patients: BloggerCourse.com  Fact Sheet for Healthcare  Providers: SeriousBroker.it  This test is not yet approved or cleared by the [place] FDA and  has been authorized for detection and/or diagnosis of SARS-CoV-2 by FDA under an Emergency Use Authorization (EUA).  This EUA will remain in effect (meaning this test can be used ) for the duration of  the COVID-19 declaration under Section 564(b)(1) of the Act, 21 U.S.C. section 360bbb-3(b)(1), unless the authorization is terminated or revoked sooner.     Influenza A by PCR NEGATIVE NEGATIVE Final   Influenza B by PCR NEGATIVE NEGATIVE Final    Comment: (NOTE) The Xpert Xpress SARS-CoV-2/FLU/RSV plus assay is intended as an aid in the diagnosis of influenza from Nasopharyngeal swab specimens and should not be used as a sole basis for treatment. Nasal washings and aspirates are unacceptable for Xpert Xpress SARS-CoV-2/FLU/RSV testing.  Fact Sheet for Patients: BloggerCourse.com  Fact Sheet for Healthcare Providers: SeriousBroker.it  This test is not yet approved or cleared by the [place] FDA and has been authorized for detection and/or diagnosis of SARS-CoV-2 by FDA under an Emergency Use Authorization (EUA). This EUA will remain in effect (meaning this test can be used) for the duration of the COVID-19 declaration under Section 564(b)(1) of the Act, 21 U.S.C. section 360bbb-3(b)(1), unless the authorization is terminated or revoked.  Performed at [facility], [address] [place], [place] [address]     Radiology Reports CT Angio Chest PE W and/or Wo Contrast  Result Date: 09/14/2020 CLINICAL DATA:  PE suspected, recent diagnosis of COVID, chest pain EXAM: CT ANGIOGRAPHY CHEST WITH CONTRAST TECHNIQUE: Multidetector CT imaging of the chest was performed using the standard protocol during bolus administration of intravenous contrast. Multiplanar CT image reconstructions and MIPs were  obtained to evaluate the vascular anatomy. CONTRAST:  OMNIPAQUE IOHEXOL 350 MG/ML SOLN COMPARISON:  None. FINDINGS: Cardiovascular: Satisfactory opacification of the pulmonary arteries to the segmental level. No evidence of pulmonary embolism. Normal heart size. No pericardial effusion. Mediastinum/Nodes: No enlarged mediastinal, hilar, or axillary lymph nodes. Thyroid gland, trachea, and esophagus demonstrate no significant findings. Lungs/Pleura: Diffuse bilateral bronchial wall thickening. Trace bilateral pleural effusions. Upper Abdomen: No acute abnormality. Musculoskeletal: No chest wall abnormality. No acute or significant osseous findings. Review of the MIP images confirms the above findings. IMPRESSION: 1. Negative examination for pulmonary embolism. 2. Diffuse bilateral bronchial wall thickening, consistent with nonspecific infectious or inflammatory bronchitis. 3. Trace bilateral pleural effusions. 4. No acute airspace disease per specifically reported history of COVID. Electronically Signed   By: Lauralyn Primes M.D.   On: 09/14/2020 19:25   DG Chest Port 1 View  Result Date: 09/14/2020 CLINICAL DATA:  Chest pain COVID 19 positivity with shortness of breath EXAM: PORTABLE CHEST 1 VIEW COMPARISON:  04/05/2015 FINDINGS: The heart size and mediastinal contours are within normal limits. Both lungs are clear. The visualized skeletal structures are unremarkable. IMPRESSION: No active disease. Electronically Signed   By: Alcide Clever M.D.   On: 09/14/2020 09:52

## 2021-07-12 IMAGING — DX DG CHEST 1V PORT
1 series · 1 of 1 positions shown · non-contrast
Comparison: 04/05/2015

CLINICAL DATA: Chest pain COVID 19 positivity with shortness of
breath

EXAM:
PORTABLE CHEST 1 VIEW

[chest]
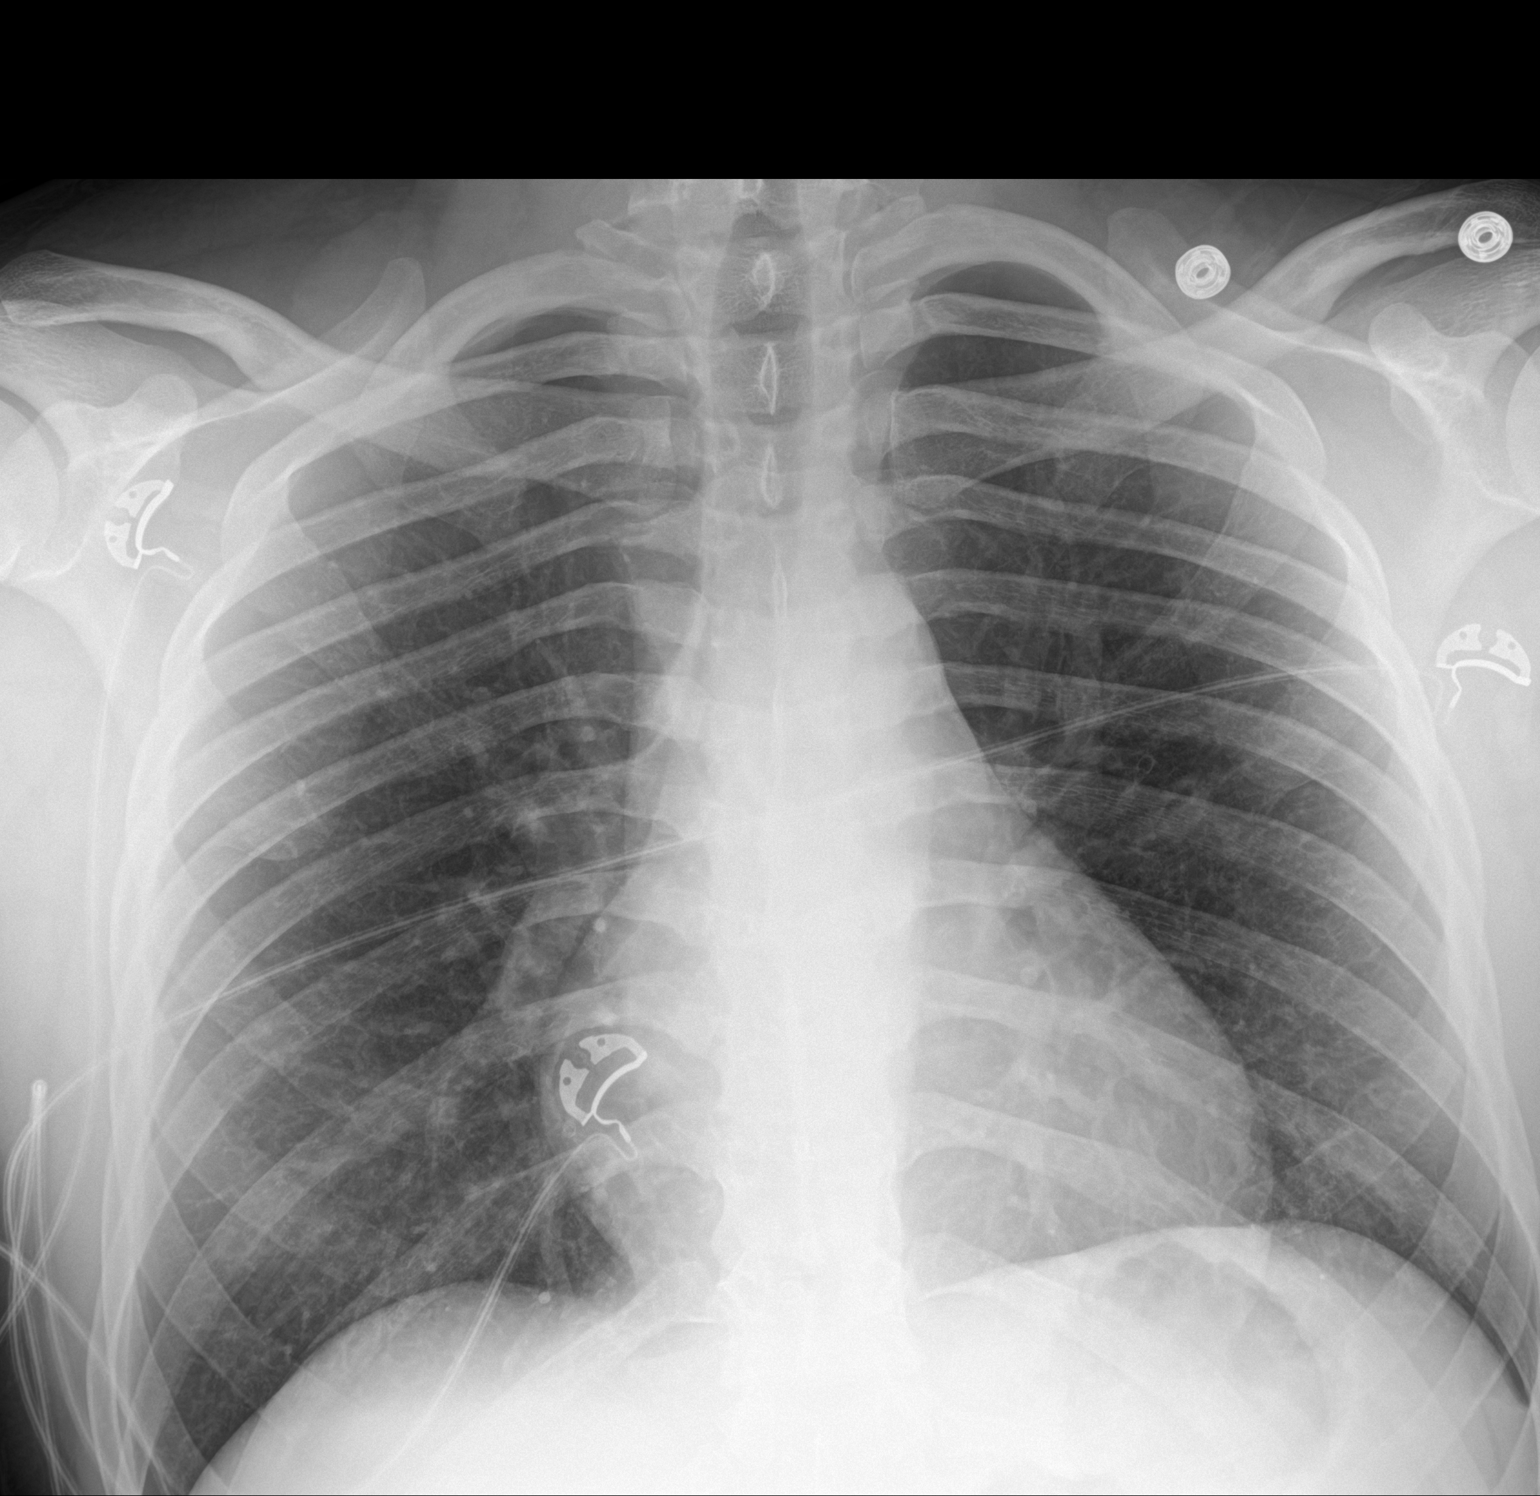

[1 of 1 positions shown; findings below may reference images not displayed]

FINDINGS: The heart size and mediastinal contours are within normal limits.
Both lungs are clear. The visualized skeletal structures are
unremarkable.
IMPRESSION: No active disease.

## 2021-07-12 IMAGING — CT CT ANGIO CHEST
2 of 7 series · 18 of 46 positions shown · IV contrast (APPLIED)
Comparison: None.

CLINICAL DATA: PE suspected, recent diagnosis of COVID, chest pain

EXAM:
CT ANGIOGRAPHY CHEST WITH CONTRAST
TECHNIQUE: Multidetector CT imaging of the chest was performed using the
standard protocol during bolus administration of intravenous
contrast. Multiplanar CT image reconstructions and MIPs were
obtained to evaluate the vascular anatomy.
CONTRAST:  100mL OMNIPAQUE IOHEXOL 350 MG/ML SOLN

[Series 7: thins · axial · 0.74mm/px · z∈[-450,-203]mm · 15 of 396 slices shown]
[im 22/396  lung]
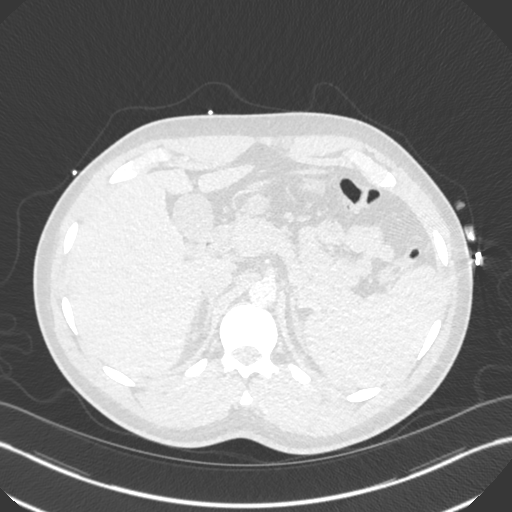
[im 44/396  soft-tissue]
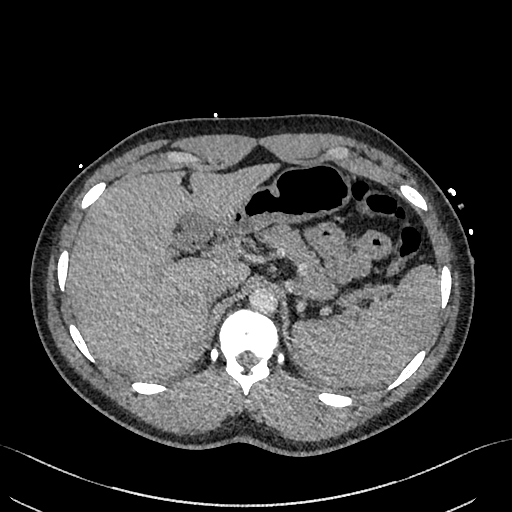
[im 66/396  lung]
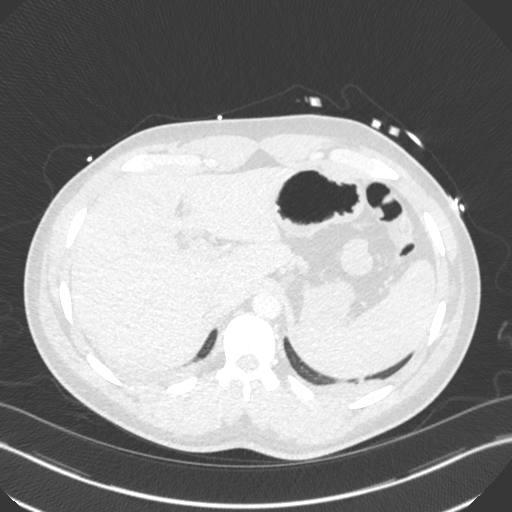
[im 88/396  soft-tissue]
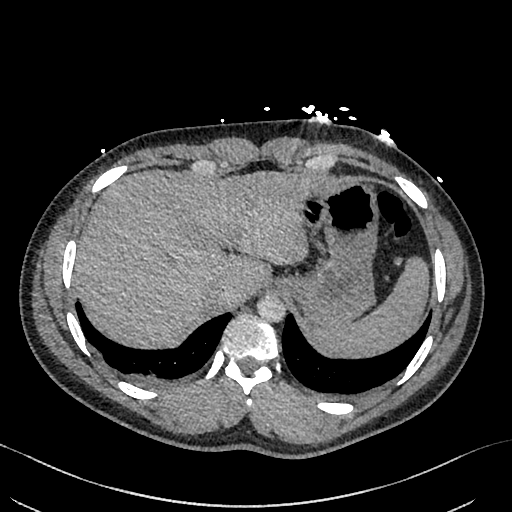
[im 132/396  lung]
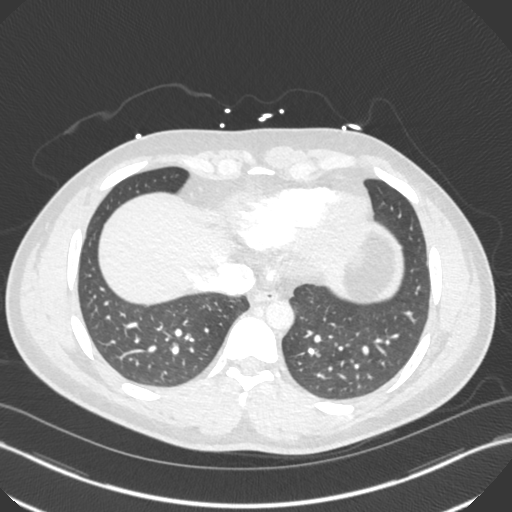
[im 154/396  soft-tissue]
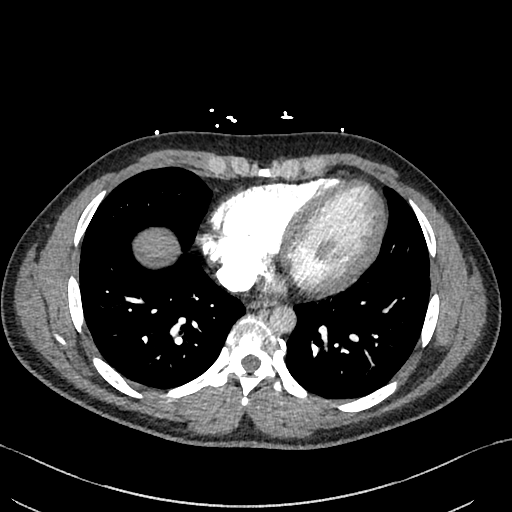
[im 176/396  lung]
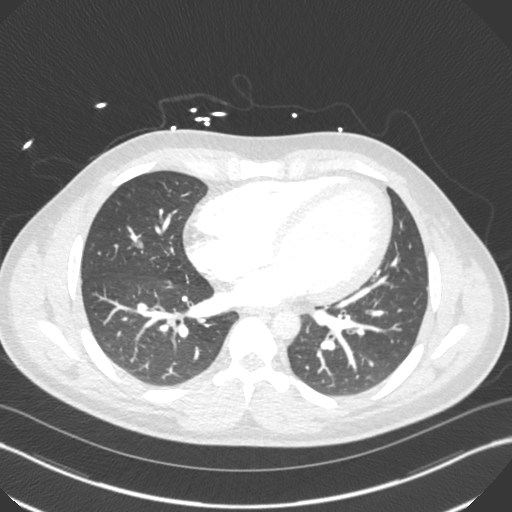
[im 198/396  soft-tissue]
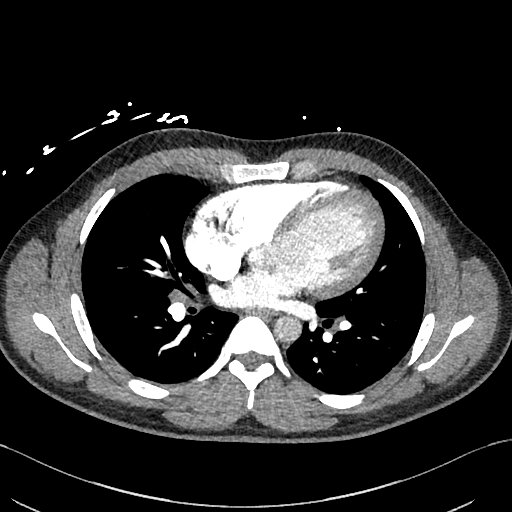
[im 220/396  lung]
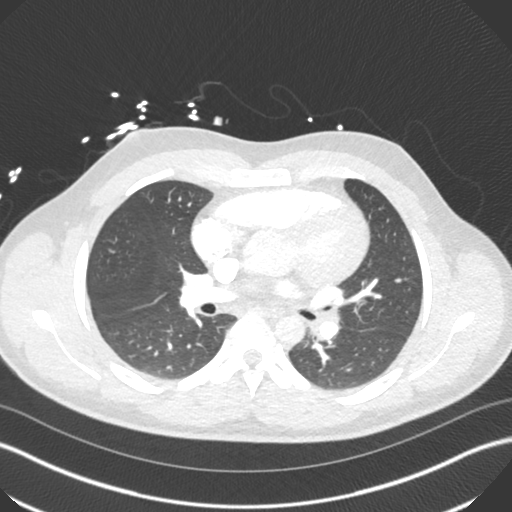
[im 242/396  soft-tissue]
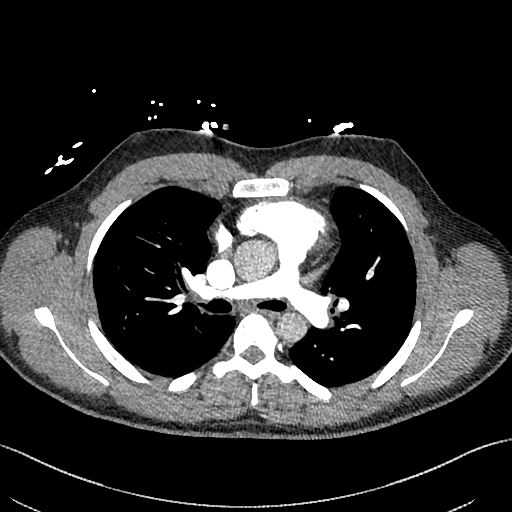
[im 264/396  lung]
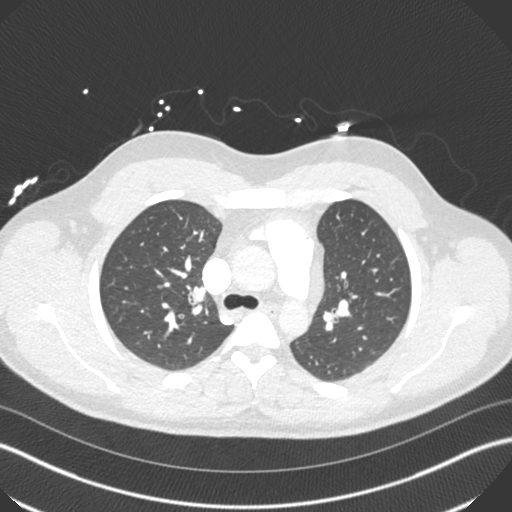
[im 308/396  soft-tissue]
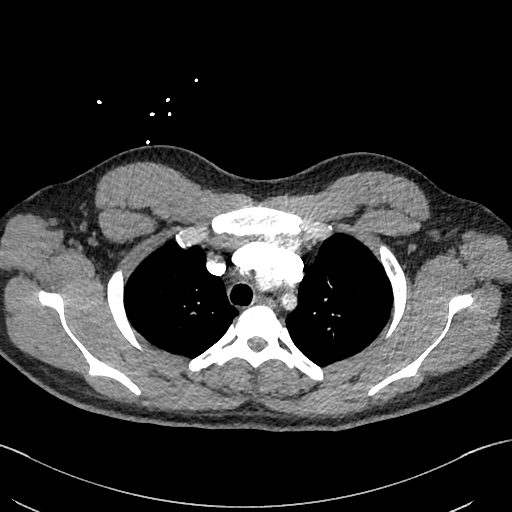
[im 330/396  lung]
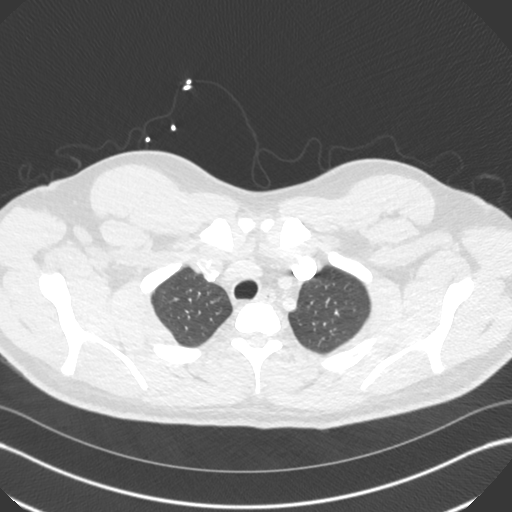
[im 352/396  soft-tissue]
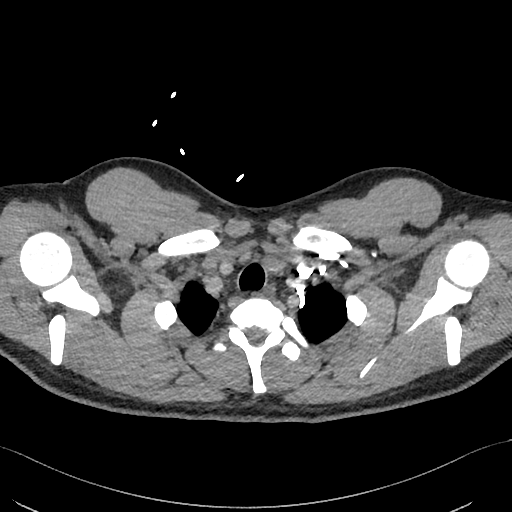
[im 374/396  lung]
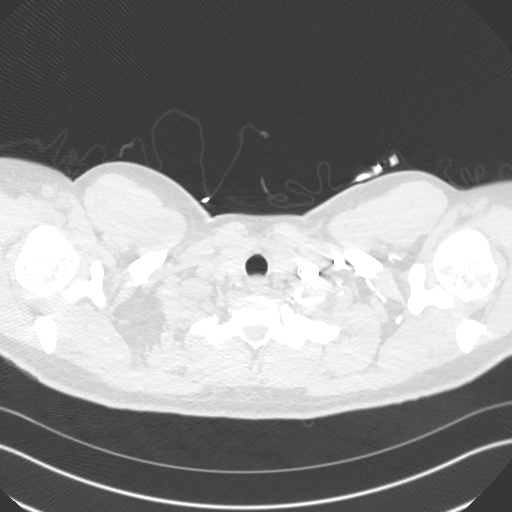

[Series 8: cor · coronal · 0.54mm/px · 3 of 131 slices shown]
[im 33/131  soft-tissue]
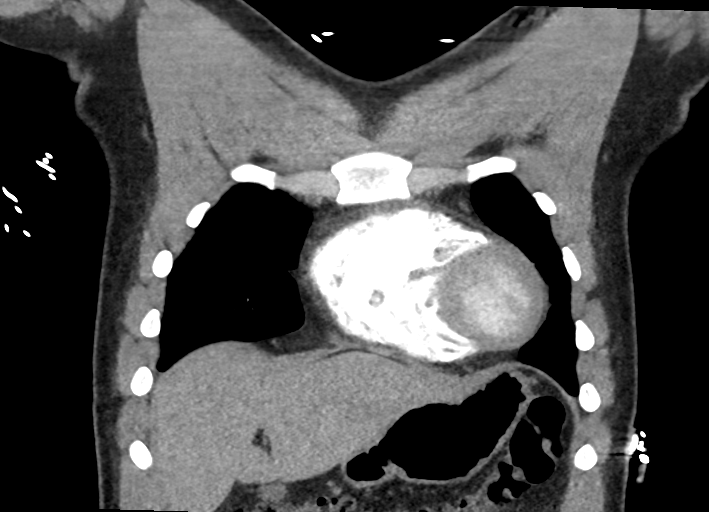
[im 66/131  soft-tissue]
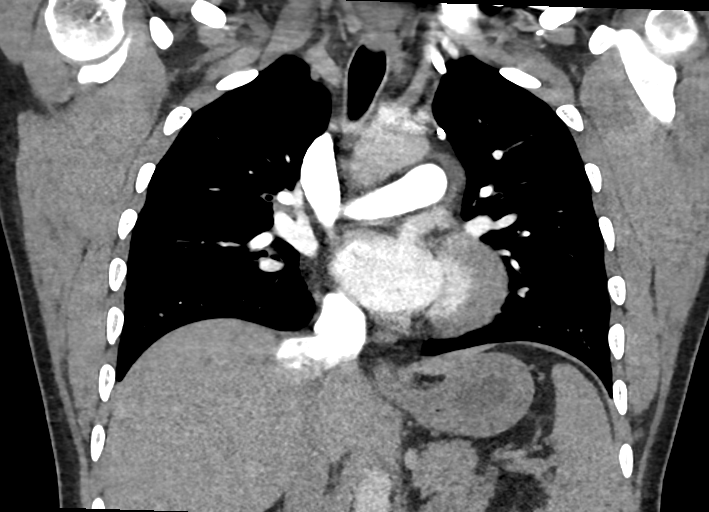
[im 98/131  soft-tissue]
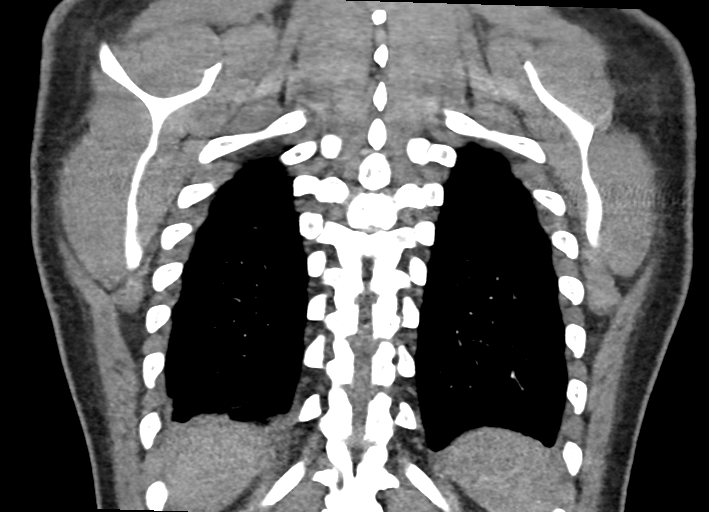

[18 of 46 positions shown; findings below may reference images not displayed]

FINDINGS: Cardiovascular: Satisfactory opacification of the pulmonary arteries
to the segmental level. No evidence of pulmonary embolism. Normal
heart size. No pericardial effusion.

Mediastinum/Nodes: No enlarged mediastinal, hilar, or axillary lymph
nodes. Thyroid gland, trachea, and esophagus demonstrate no
significant findings.

Lungs/Pleura: Diffuse bilateral bronchial wall thickening. Trace
bilateral pleural effusions.

Upper Abdomen: No acute abnormality.

Musculoskeletal: No chest wall abnormality. No acute or significant
osseous findings.

Review of the MIP images confirms the above findings.
IMPRESSION: 1. Negative examination for pulmonary embolism.
2. Diffuse bilateral bronchial wall thickening, consistent with
nonspecific infectious or inflammatory bronchitis.
3. Trace bilateral pleural effusions.
4. No acute airspace disease per specifically reported history of
COVID.

## 2022-06-12 ENCOUNTER — Emergency Department (HOSPITAL_COMMUNITY)
Admission: EM | Admit: 2022-06-12 | Discharge: 2022-06-13 | Disposition: A | Discharge status: Home / Self Care | Payer: BC Managed Care – PPO | Attending: Emergency Medicine | Admitting: Emergency Medicine

## 2022-06-12 ENCOUNTER — Other Ambulatory Visit: Payer: Self-pay

## 2022-06-12 DIAGNOSIS — Z21 Asymptomatic human immunodeficiency virus [HIV] infection status: Secondary | ICD-10-CM | POA: Diagnosis not present

## 2022-06-12 DIAGNOSIS — R112 Nausea with vomiting, unspecified: Secondary | ICD-10-CM | POA: Insufficient documentation

## 2022-06-12 DIAGNOSIS — J45909 Unspecified asthma, uncomplicated: Secondary | ICD-10-CM | POA: Diagnosis not present

## 2022-06-12 DIAGNOSIS — R1084 Generalized abdominal pain: Secondary | ICD-10-CM | POA: Insufficient documentation

## 2022-06-12 DIAGNOSIS — E860 Dehydration: Secondary | ICD-10-CM | POA: Insufficient documentation

## 2022-06-12 DIAGNOSIS — R109 Unspecified abdominal pain: Secondary | ICD-10-CM | POA: Diagnosis present

## 2022-06-12 MED ORDER — ONDANSETRON 4 MG PO TBDP
4.0000 mg | ORAL_TABLET | Freq: Once | ORAL | Status: AC
  Administered 2022-06-13: 4 mg via ORAL
  Filled 2022-06-12: qty 1

## 2022-06-12 NOTE — ED Triage Notes (Signed)
Pt via POV c/o left sided abdominal pain with NVD. Was seen at [facility] on Monday, DX GI virus, and prescribed Zofran. Pt states returning of symptoms today. Pt vomiting in triage.

## 2022-06-12 NOTE — ED Provider Triage Note (Signed)
Emergency Medicine Provider Triage Evaluation Note  Brentyn Seehafer , a 27 y.o. male  was evaluated in triage.  Pt complains of N/V/D, ongoing since Sunday.  Seen at [facility] Monday and given zofran but no longer working.  Denies abdominal pain, fever.  Hx covid 3 weeks ago.  Review of Systems  Positive: N/V/D Negative: fever  Physical Exam  BP (!) 131/99 (BP Location: Left Arm)   Pulse 98   Temp 98.6 F (37 C)   Resp 18   Ht 5\' 7"  (1.702 m)   Wt 83.9 kg   SpO2 100%   BMI 28.98 kg/m   Gen:   Awake, no distress   Resp:  Normal effort  MSK:   Moves extremities without difficulty  Other:  Abdomen soft, non-tender  Medical Decision Making  Medically screening exam initiated at 11:31 PM.  Appropriate orders placed.  Leovardo Thoman was informed that the remainder of the evaluation will be completed by another provider, this initial triage assessment does not replace that evaluation, and the importance of remaining in the ED until their evaluation is complete.  N/V/D.  Recent covid infection 3 weeks ago.  Labs ordered, zofran given.   Larene Pickett, PA-C 06/12/22 2359

## 2022-06-13 ENCOUNTER — Emergency Department (HOSPITAL_COMMUNITY): Payer: BC Managed Care – PPO

## 2022-06-13 LAB — URINALYSIS, ROUTINE W REFLEX MICROSCOPIC
Bacteria, UA: NONE SEEN
Bilirubin Urine: NEGATIVE
Glucose, UA: NEGATIVE mg/dL
Hgb urine dipstick: NEGATIVE
Ketones, ur: NEGATIVE mg/dL
Leukocytes,Ua: NEGATIVE
Nitrite: NEGATIVE
Protein, ur: 30 mg/dL — AB
Specific Gravity, Urine: 1.021 (ref 1.005–1.030)
pH: 5 (ref 5.0–8.0)

## 2022-06-13 LAB — CBC WITH DIFFERENTIAL/PLATELET
Abs Immature Granulocytes: 0 10*3/uL (ref 0.00–0.07)
Basophils Absolute: 0.1 10*3/uL (ref 0.0–0.1)
Basophils Relative: 2 %
Eosinophils Absolute: 0.1 10*3/uL (ref 0.0–0.5)
Eosinophils Relative: 3 %
HCT: 49.9 % (ref 39.0–52.0)
Hemoglobin: 17.9 g/dL — ABNORMAL HIGH (ref 13.0–17.0)
Lymphocytes Relative: 57 %
Lymphs Abs: 2.2 10*3/uL (ref 0.7–4.0)
MCH: 29.9 pg (ref 26.0–34.0)
MCHC: 35.9 g/dL (ref 30.0–36.0)
MCV: 83.3 fL (ref 80.0–100.0)
Monocytes Absolute: 0.7 10*3/uL (ref 0.1–1.0)
Monocytes Relative: 17 %
Neutro Abs: 0.8 10*3/uL — ABNORMAL LOW (ref 1.7–7.7)
Neutrophils Relative %: 21 %
Platelets: 324 10*3/uL (ref 150–400)
RBC: 5.99 MIL/uL — ABNORMAL HIGH (ref 4.22–5.81)
RDW: 12 % (ref 11.5–15.5)
WBC: 3.9 10*3/uL — ABNORMAL LOW (ref 4.0–10.5)
nRBC: 0 % (ref 0.0–0.2)
nRBC: 0 /100 WBC

## 2022-06-13 LAB — COMPREHENSIVE METABOLIC PANEL
ALT: 23 U/L (ref 0–44)
AST: 28 U/L (ref 15–41)
Albumin: 4.6 g/dL (ref 3.5–5.0)
Alkaline Phosphatase: 65 U/L (ref 38–126)
Anion gap: 12 (ref 5–15)
BUN: 18 mg/dL (ref 6–20)
CO2: 27 mmol/L (ref 22–32)
Calcium: 9.8 mg/dL (ref 8.9–10.3)
Chloride: 96 mmol/L — ABNORMAL LOW (ref 98–111)
Creatinine, Ser: 1.53 mg/dL — ABNORMAL HIGH (ref 0.61–1.24)
GFR, Estimated: >60 mL/min (ref >60)
Glucose, Bld: 92 mg/dL (ref 70–99)
Potassium: 3.6 mmol/L (ref 3.5–5.1)
Sodium: 135 mmol/L (ref 135–145)
Total Bilirubin: 1 mg/dL (ref 0.3–1.2)
Total Protein: 8.7 g/dL — ABNORMAL HIGH (ref 6.5–8.1)

## 2022-06-13 LAB — LIPASE, BLOOD: Lipase: 39 U/L (ref 11–51)

## 2022-06-13 MED ORDER — IOHEXOL 350 MG/ML SOLN
75.0000 mL | Freq: Once | INTRAVENOUS | Status: AC | PRN
  Administered 2022-06-13: 75 mL via INTRAVENOUS

## 2022-06-13 MED ORDER — PROMETHAZINE HCL 25 MG RE SUPP: 25.0000 mg | Freq: Four times a day (QID) | RECTAL | 0 refills | Status: AC | PRN

## 2022-06-13 MED ORDER — ONDANSETRON HCL 4 MG/2ML IJ SOLN
4.0000 mg | Freq: Once | INTRAMUSCULAR | Status: AC
  Administered 2022-06-13: 4 mg via INTRAVENOUS
  Filled 2022-06-13: qty 2

## 2022-06-13 MED ORDER — SODIUM CHLORIDE 0.9 % IV BOLUS
1000.0000 mL | Freq: Once | INTRAVENOUS | Status: AC
  Administered 2022-06-13: 1000 mL via INTRAVENOUS

## 2022-06-13 NOTE — ED Provider Notes (Signed)
[facility] EMERGENCY DEPARTMENT Provider Note   CSN: [id number] Arrival date & time: 06/12/22  2235     History  Chief Complaint  Patient presents with   Abdominal Pain    Kenneth Frank is a 27 y.o. male.  Pt is a 27 yo male with a pmhx significant for HIV and asthma.  He said he's been compliant with his HIV meds.  His last CD4 counts ok in June.  Viral load <20.  Pt said he's had n/v/d since Sunday, 1/7.  He did go to UC on the 8th and was given zofran, but it is not helping.  He does have some abd pain intermittently, but none now.  It is mostly when he vomits.  No fever.  He did have Covid 3 weeks ago.       Home Medications Prior to Admission medications   Medication Sig Start Date End Date Taking? Authorizing Provider  promethazine (PHENERGAN) 25 MG suppository Place 1 suppository (25 mg total) rectally every 6 (six) hours as needed for nausea or vomiting. 06/13/22  Yes Isla Pence, MD  acetaminophen (TYLENOL) 325 MG tablet Take 2 tablets (650 mg total) by mouth every 6 (six) hours as needed for mild pain (or Fever >/= 101). 09/15/20   Ghimire, Henreitta Leber, MD  albuterol (VENTOLIN HFA) 108 (90 Base) MCG/ACT inhaler Inhale 1-2 puffs into the lungs every 6 (six) hours as needed for wheezing or shortness of breath. 09/15/20   Ghimire, Henreitta Leber, MD  benzonatate (TESSALON) 100 MG capsule Take 1-2 capsules (100-200 mg total) by mouth 3 (three) times daily as needed for cough. 09/15/20   Ghimire, Henreitta Leber, MD  bictegravir-emtricitabine-tenofovir AF (BIKTARVY) 50-200-25 MG TABS tablet Take 1 tablet by mouth daily. 07/18/20   [provider]  cetirizine (ZYRTEC ALLERGY) 10 MG tablet Take 1 tablet (10 mg total) by mouth daily. 05/19/17   Jaynee Eagles, PA-C      Allergies    Other    Review of Systems   Review of Systems  Gastrointestinal:  Positive for nausea and vomiting.  All other systems reviewed and are negative.   Physical Exam Updated Vital  Signs BP (!) 126/90   Pulse 64   Temp 98.2 F (36.8 C) (Oral)   Resp 18   Ht 5\' 7"  (1.702 m)   Wt 83.9 kg   SpO2 98%   BMI 28.98 kg/m  Physical Exam Vitals and nursing note reviewed.  Constitutional:      Appearance: He is well-developed.  HENT:     Head: Normocephalic and atraumatic.     Mouth/Throat:     Mouth: Mucous membranes are dry.     Pharynx: Oropharynx is clear.  Eyes:     Extraocular Movements: Extraocular movements intact.     Pupils: Pupils are equal, round, and reactive to light.  Cardiovascular:     Rate and Rhythm: Normal rate and regular rhythm.     Heart sounds: Normal heart sounds.  Pulmonary:     Effort: Pulmonary effort is normal.     Breath sounds: Normal breath sounds.  Abdominal:     General: Abdomen is flat. Bowel sounds are normal.     Palpations: Abdomen is soft.     Tenderness: There is generalized abdominal tenderness.  Skin:    General: Skin is warm.     Capillary Refill: Capillary refill takes less than 2 seconds.  Neurological:     General: No focal deficit present.  Mental Status: He is alert and oriented to person, place, and time.  Psychiatric:        Mood and Affect: Mood normal.        Behavior: Behavior normal.     ED Results / Procedures / Treatments   Labs (all labs ordered are listed, but only abnormal results are displayed) Labs Reviewed  CBC WITH DIFFERENTIAL/PLATELET - Abnormal; Notable for the following components:      Result Value   WBC 3.9 (*)    RBC 5.99 (*)    Hemoglobin 17.9 (*)    Neutro Abs 0.8 (*)    All other components within normal limits  COMPREHENSIVE METABOLIC PANEL - Abnormal; Notable for the following components:   Chloride 96 (*)    Creatinine, Ser 1.53 (*)    Total Protein 8.7 (*)    All other components within normal limits  URINALYSIS, ROUTINE W REFLEX MICROSCOPIC - Abnormal; Notable for the following components:   Color, Urine AMBER (*)    APPearance HAZY (*)    Protein, ur 30 (*)     All other components within normal limits  LIPASE, BLOOD    EKG None  Radiology CT ABDOMEN PELVIS W CONTRAST  Result Date: 06/13/2022 CLINICAL DATA:  Abdominal pain. Nausea vomiting and diarrhea. COVID 3 weeks ago EXAM: CT ABDOMEN AND PELVIS WITH CONTRAST TECHNIQUE: Multidetector CT imaging of the abdomen and pelvis was performed using the standard protocol following bolus administration of intravenous contrast. RADIATION DOSE REDUCTION: This exam was performed according to the departmental dose-optimization program which includes automated exposure control, adjustment of the mA and/or kV according to patient size and/or use of iterative reconstruction technique. CONTRAST:  65mL OMNIPAQUE IOHEXOL 350 MG/ML SOLN COMPARISON:  CT 09/26/2016 and older FINDINGS: Lower chest: There is breathing motion along the lung bases. No pleural effusion. 3 mm subpleural nodule along the middle lobe on image 2 of series 4. nonspecific. No specific imaging follow-up. Hepatobiliary: No space-occupying liver lesion. Preserved enhancement. Patent portal vein. The gallbladder is mildly distended. Pancreas: Preserved pancreatic parenchyma without abnormal mass or enhancement. Spleen: Spleen is nonenlarged.  Preserved homogeneous enhancement. Adrenals/Urinary Tract: The adrenal glands are preserved. No renal mass identified. No collecting system dilatation. Preserved contours of the urinary bladder. Stomach/Bowel: On this non oral contrast exam, large bowel has a normal course and caliber with scattered colonic stool. Normal appendix extends inferior to cecum in the right lower quadrant. Mild fluid and air in the nondilated stomach. The small bowel is nondilated. Vascular/Lymphatic: Normal caliber aorta and IVC. Circumaortic left renal vein. There is no specific abnormal lymph node enlargement seen in the retroperitoneum or pelvis. There are several prominent but not pathologically enlarged mesenteric lymph nodes more than usually  seen. Please correlate with specific history. Reproductive: Prostate is unremarkable. Other: No abdominal wall hernia or abnormality. No abdominopelvic ascites. Only a tiny fat containing umbilical hernia is seen. Musculoskeletal: Multilevel Schmorl's node deformities along the visualized lower thoracic spine. IMPRESSION: No bowel obstruction, free air or free fluid.  Normal appendix. Multiple prominent but not pathologically enlarged mesenteric lymph nodes identified, more numerous than usually seen. Of note these were seen on the study of 2018 and are actually smaller today. Please correlate with any prior workup and history Electronically Signed   By: Jill Side M.D.   On: 06/13/2022 10:41    Procedures Procedures    Medications Ordered in ED Medications  ondansetron (ZOFRAN-ODT) disintegrating tablet 4 mg (4 mg Oral Given 06/13/22 0003)  sodium chloride 0.9 % bolus 1,000 mL (1,000 mLs Intravenous New Bag/Given 06/13/22 0943)  ondansetron (ZOFRAN) injection 4 mg (4 mg Intravenous Given 06/13/22 0942)  iohexol (OMNIPAQUE) 350 MG/ML injection 75 mL (75 mLs Intravenous Contrast Given 06/13/22 1033)    ED Course/ Medical Decision Making/ A&P                           Medical Decision Making Amount and/or Complexity of Data Reviewed Radiology: ordered.  Risk Prescription drug management.   This patient presents to the ED for concern of abd pain, this involves an extensive number of treatment options, and is a complaint that carries with it a high risk of complications and morbidity.  The differential diagnosis includes viral gastroenteritis, colitis, appendicitis, infection   Co morbidities that complicate the patient evaluation  HIV   Additional history obtained:  Additional history obtained from epic chart review  Lab Tests:  I Ordered, and personally interpreted labs.  The pertinent results include:  cbc with wbc 3.9, ua with protein, lip nl, cmp with cr 1.53 (cr 1 year ago was  1.32)   Imaging Studies ordered:  I ordered imaging studies including ct abd/pelvis  I independently visualized and interpreted imaging which showed  No bowel obstruction, free air or free fluid.  Normal appendix.    Multiple prominent but not pathologically enlarged mesenteric lymph  nodes identified, more numerous than usually seen. Of note these  were seen on the study of 2018 and are actually smaller today.  Please correlate with any prior workup and history   I agree with the radiologist interpretation   Cardiac Monitoring:  The patient was maintained on a cardiac monitor.  I personally viewed and interpreted the cardiac monitored which showed an underlying rhythm of: nsr   Medicines ordered and prescription drug management:  I ordered medication including IVFs and zofran  for dehydration and n/v  Reevaluation of the patient after these medicines showed that the patient improved I have reviewed the patients home medicines and have made adjustments as needed   Test Considered:  ct   Critical Interventions:  ivfs   Problem List / ED Course:  N/v and abd pain:  labs ok; ct ok.  Pt feels better after fluids and meds.  He is tolerating po fluids.  He is stable for d/c.  Rx given for phenergan supp to take if the zofran is not working.   Reevaluation:  After the interventions noted above, I reevaluated the patient and found that they have :improved   Social Determinants of Health:  Lives at home   Dispostion:  After consideration of the diagnostic results and the patients response to treatment, I feel that the patent would benefit from discharge with outpatient f/u.          Final Clinical Impression(s) / ED Diagnoses Final diagnoses:  Generalized abdominal pain  Dehydration  Nausea and vomiting, unspecified vomiting type    Rx / DC Orders ED Discharge Orders          Ordered    promethazine (PHENERGAN) 25 MG suppository  Every 6 hours PRN         06/13/22 1127              Jacalyn Lefevre, MD 06/13/22 1128

## 2024-05-13 ENCOUNTER — Other Ambulatory Visit: Payer: Self-pay

## 2024-05-13 ENCOUNTER — Emergency Department (HOSPITAL_COMMUNITY)
Admission: EM | Admit: 2024-05-13 | Discharge: 2024-05-13 | Disposition: A | Discharge status: Home / Self Care | Attending: Emergency Medicine | Admitting: Emergency Medicine

## 2024-05-13 ENCOUNTER — Emergency Department (HOSPITAL_COMMUNITY)

## 2024-05-13 DIAGNOSIS — Z21 Asymptomatic human immunodeficiency virus [HIV] infection status: Secondary | ICD-10-CM | POA: Insufficient documentation

## 2024-05-13 DIAGNOSIS — R0789 Other chest pain: Secondary | ICD-10-CM | POA: Diagnosis not present

## 2024-05-13 DIAGNOSIS — R079 Chest pain, unspecified: Secondary | ICD-10-CM

## 2024-05-13 DIAGNOSIS — J45909 Unspecified asthma, uncomplicated: Secondary | ICD-10-CM | POA: Insufficient documentation

## 2024-05-13 LAB — TROPONIN I (HIGH SENSITIVITY)
Troponin I (High Sensitivity): 4 ng/L (ref <18)
Troponin I (High Sensitivity): 4 ng/L (ref <18)

## 2024-05-13 LAB — CBC
HCT: 40.1 % (ref 39.0–52.0)
Hemoglobin: 13.2 g/dL (ref 13.0–17.0)
MCH: 28.7 pg (ref 26.0–34.0)
MCHC: 32.9 g/dL (ref 30.0–36.0)
MCV: 87.2 fL (ref 80.0–100.0)
Platelets: 230 K/uL (ref 150–400)
RBC: 4.6 MIL/uL (ref 4.22–5.81)
RDW: 13 % (ref 11.5–15.5)
WBC: 4.2 K/uL (ref 4.0–10.5)
nRBC: 0 % (ref 0.0–0.2)

## 2024-05-13 LAB — BASIC METABOLIC PANEL WITH GFR
Anion gap: 6 (ref 5–15)
BUN: 15 mg/dL (ref 6–20)
CO2: 31 mmol/L (ref 22–32)
Calcium: 8.9 mg/dL (ref 8.9–10.3)
Chloride: 102 mmol/L (ref 98–111)
Creatinine, Ser: 1.18 mg/dL (ref 0.61–1.24)
GFR, Estimated: >60 mL/min (ref >60)
Glucose, Bld: 88 mg/dL (ref 70–99)
Potassium: 3.7 mmol/L (ref 3.5–5.1)
Sodium: 139 mmol/L (ref 135–145)

## 2024-05-13 MED ORDER — NAPROXEN 500 MG PO TABS: 500.0000 mg | ORAL_TABLET | Freq: Two times a day (BID) | ORAL | 0 refills | Status: AC

## 2024-05-13 MED ORDER — METHOCARBAMOL 500 MG PO TABS: 500.0000 mg | ORAL_TABLET | Freq: Two times a day (BID) | ORAL | 0 refills | Status: AC

## 2024-05-13 NOTE — Discharge Instructions (Addendum)
 I would recommend that you take naproxen every 12 hours with food for chest discomfort.  You can also use ice or heat over the area of pain. Take Robaxin  for muscle spasm but do not drive or operate heavy machinery when taking this medicine as it can cause some mild sedation. Since you do not have a primary care doctor to follow-up with I have referred you to cardiology.  You should hear from cardiology to schedule appointment. Ultimately if your symptoms are getting worse like feeling like you are going to pass out, worsening chest pain worsening shortness of breath please return to ED for recheck of symptoms.

## 2024-05-13 NOTE — ED Provider Notes (Signed)
 [facility] EMERGENCY DEPARTMENT AT [facility] Provider Note   CSN: [id number] Arrival date & time: 05/13/24  9972     Patient presents with: Chest Pain   Kenneth Frank is a 28 y.o. male. Central chest pain that seems to radiate around both sides of his chest waxing and waning worse when sitting up or when twisting side-to-side or moving arms.  Ongoing for 3 days. PERC negative.  No cardiac history.  No URI-like symptoms cough fever.    Chest Pain      Prior to Admission medications  Medication Sig Start Date End Date Taking? Authorizing Provider  acetaminophen  (TYLENOL ) 325 MG tablet Take 2 tablets (650 mg total) by mouth every 6 (six) hours as needed for mild pain (or Fever >/= 101). 09/15/20   Ghimire, Donalda HERO, MD  albuterol  (VENTOLIN  HFA) 108 (90 Base) MCG/ACT inhaler Inhale 1-2 puffs into the lungs every 6 (six) hours as needed for wheezing or shortness of breath. 09/15/20   Ghimire, Donalda HERO, MD  benzonatate  (TESSALON ) 100 MG capsule Take 1-2 capsules (100-200 mg total) by mouth 3 (three) times daily as needed for cough. 09/15/20   Ghimire, Donalda HERO, MD  bictegravir-emtricitabine -tenofovir  AF (BIKTARVY ) 50-200-25 MG TABS tablet Take 1 tablet by mouth daily. 07/18/20   [provider]  cetirizine  (ZYRTEC  ALLERGY) 10 MG tablet Take 1 tablet (10 mg total) by mouth daily. 05/19/17   Christopher Savannah, PA-C  promethazine  (PHENERGAN ) 25 MG suppository Place 1 suppository (25 mg total) rectally every 6 (six) hours as needed for nausea or vomiting. 06/13/22   Dean Clarity, MD    Allergies: Other    Review of Systems  Cardiovascular:  Positive for chest pain.    Updated Vital Signs BP 118/84 (BP Location: Right Arm)   Pulse (!) 55   Temp 98.5 F (36.9 C)   Resp 18   Ht 5' 7 (1.702 m)   Wt 88.5 kg   SpO2 99%   BMI 30.54 kg/m   Physical Exam Vitals and nursing note reviewed.  Constitutional:      General: He is not in acute distress.    Appearance: He is  not toxic-appearing.  HENT:     Head: Normocephalic and atraumatic.  Eyes:     General: No scleral icterus.    Conjunctiva/sclera: Conjunctivae normal.  Cardiovascular:     Rate and Rhythm: Normal rate and regular rhythm.     Pulses: Normal pulses.     Heart sounds: Normal heart sounds.  Pulmonary:     Effort: Pulmonary effort is normal. No respiratory distress.     Breath sounds: Normal breath sounds.  Chest:     Chest wall: Tenderness present.  Abdominal:     General: Abdomen is flat. Bowel sounds are normal.     Palpations: Abdomen is soft.     Tenderness: There is no abdominal tenderness.  Musculoskeletal:       Arms:  Skin:    General: Skin is warm and dry.     Findings: No lesion.  Neurological:     General: No focal deficit present.     Mental Status: He is alert and oriented to person, place, and time. Mental status is at baseline.     (all labs ordered are listed, but only abnormal results are displayed) Labs Reviewed  BASIC METABOLIC PANEL WITH GFR  CBC  TROPONIN I (HIGH SENSITIVITY)  TROPONIN I (HIGH SENSITIVITY)    EKG: EKG Interpretation Date/Time:  Thursday May 13 2024 00:36:39 EST Ventricular Rate:  79 PR Interval:  146 QRS Duration:  102 QT Interval:  364 QTC Calculation: 417 R Axis:   -21  Text Interpretation: Sinus rhythm with marked sinus arrhythmia Minimal voltage criteria for LVH, may be normal variant ( R in aVL ) Septal infarct , age undetermined Abnormal ECG When compared with ECG of 14-Sep-2020 13:38, No significant change was found Confirmed by Raford Lenis ([id number]) on 05/13/2024 3:10:19 AM  Radiology: [facility] Chest 2 View Result Date: 05/13/2024 EXAM: 2 VIEW(S) XRAY OF THE CHEST 05/13/2024 01:05:00 AM COMPARISON: 09/14/2020. CLINICAL HISTORY: cp FINDINGS: LUNGS AND PLEURA: No focal pulmonary opacity. No pleural effusion. No pneumothorax. HEART AND MEDIASTINUM: No acute abnormality of the cardiac and mediastinal silhouettes. BONES AND  SOFT TISSUES: Humeral fixation hardware noted on the lateral view. IMPRESSION: 1. No acute cardiopulmonary process. Electronically signed by: Pinkie Pebbles MD 05/13/2024 01:58 AM EST RP Workstation: [id number]     Procedures   Medications Ordered in the ED - No data to display                                  Medical Decision Making Amount and/or Complexity of Data Reviewed Labs: ordered. Radiology: ordered.  Risk Prescription drug management.   This patient presents to the ED for concern of chest pain, this involves an extensive number of treatment options, and is a complaint that carries with it a high risk of complications and morbidity.  The differential diagnosis includes ACS, stable angina, CHF, pneumonia, pneumothorax, aortic dissection, pulmonary embolus    Co morbidities that complicate the patient evaluation  Patient has history of asthma, history of traumatic epidural hematoma, history of abnormal EKG, HIV on Biktarvy  recently undetectable viral load.   Additional history obtained:  Additional history obtained from reviewed patient's ER stay 09/15/2020 in which patient had CT angio and echo.  At that time he had normal echocardiogram.    Lab Tests:  I personally interpreted labs.  The pertinent results include:   CBC, BMP unremarkable Troponin unremarkable x 2  Patient is PERC negative due to normal oxygen  saturation, normal heart rate, lack of unilateral swelling, lack of hemoptysis lack of recent surgery lack of history of PE DVT, lack of hormone use.   Imaging Studies ordered:  I ordered imaging studies including chest x-ray I independently visualized and interpreted imaging which showed no acute findings.  I agree with the radiologist interpretation   Cardiac Monitoring: / EKG:  The patient was maintained on a cardiac monitor.  I personally viewed and interpreted the cardiac monitored which showed an underlying rhythm of: sinus with possible LVH,  unchanged since prior EKG   Problem List / ED Course / Critical interventions / Medication management  Patient presents emergency room with complaint of 3 days of chest pain.  Patient reports that his chest pain is central and constant.  He notes that it is worse when touching the area and it is worse when he is sitting up, coughing or moving his arms.  He denies any shortness of breath, leg swelling, fever or cough preceding this.  On arrival patient hemodynamically stable and he is well-appearing.  On my exam, he has reproducible tenderness over anterior chest wall.  This is also aggravated with movement.  He has no leg swelling or sign of fluid overload.  He also has no chest congestion or cardiomegaly on chest x-ray thus doubt something  like CHF or myocarditis/pericarditis.  He has stable EKG findings and normal troponin x 2 thus doubt ACS.  His story does seem atypical for ACS.  He is PERC negative thus doubt DVT PE as cause of symptoms.  Symptoms do not seem consistent with aortic dissection.  No evidence of pneumonia or pneumothorax on chest x-ray. Given patient's symptoms I think this is most likely musculoskeletal in etiology.  I offered medication however patient reports he is feeling better like to try medications when he gets home due to duration of stay in ED. I have reviewed the patients home medicines and have made adjustments as needed. Will refer to cardiology for recheck as he does not currently have a primary care to follow-up with.  He was given return precautions.  Feel stable for discharge at this time.      Final diagnoses:  Chest pain, unspecified type    ED Discharge Orders          Ordered    Ambulatory referral to Cardiology       Comments: If you have not heard from the Cardiology office within the next 72 hours please call [phone].   05/13/24 0959    naproxen (NAPROSYN) 500 MG tablet  2 times daily        05/13/24 0959    methocarbamol  (ROBAXIN ) 500 MG  tablet  2 times daily        05/13/24 0959               Vinh Sachs, Warren SAILOR, PA-C 05/13/24 1019    Doretha Folks, MD 05/14/24 [phone]

## 2024-05-13 NOTE — ED Notes (Signed)
ED PA at [facility] 

## 2024-05-13 NOTE — ED Triage Notes (Signed)
 Pt arrives POV with complaints of centralized chest pain x 3 days. Denies radiation and other sx.

## 2024-05-14 ENCOUNTER — Ambulatory Visit: Attending: Physician Assistant | Admitting: Physician Assistant
